# Patient Record
Sex: Female | Born: 1940 | Race: White | Hispanic: No | Marital: Married | Smoking: Never smoker
Health system: Southern US, Community
[De-identification: ages and names within clinical notes are randomized; demographics above are authoritative.]

## PROBLEM LIST (undated history)

## (undated) DIAGNOSIS — E0590 Thyrotoxicosis, unspecified without thyrotoxic crisis or storm: Secondary | ICD-10-CM

## (undated) DIAGNOSIS — E079 Disorder of thyroid, unspecified: Secondary | ICD-10-CM

## (undated) DIAGNOSIS — K56609 Unspecified intestinal obstruction, unspecified as to partial versus complete obstruction: Secondary | ICD-10-CM

## (undated) DIAGNOSIS — R0600 Dyspnea, unspecified: Secondary | ICD-10-CM

## (undated) DIAGNOSIS — J189 Pneumonia, unspecified organism: Secondary | ICD-10-CM

## (undated) DIAGNOSIS — I10 Essential (primary) hypertension: Secondary | ICD-10-CM

## (undated) DIAGNOSIS — M1990 Unspecified osteoarthritis, unspecified site: Secondary | ICD-10-CM

## (undated) DIAGNOSIS — F419 Anxiety disorder, unspecified: Secondary | ICD-10-CM

## (undated) HISTORY — PX: ABDOMINAL HYSTERECTOMY: SHX81

## (undated) HISTORY — PX: THYROID SURGERY: SHX805

## (undated) HISTORY — PX: CHOLECYSTECTOMY: SHX55

---

## 1978-07-10 HISTORY — PX: CHOLECYSTECTOMY: SHX55

## 1989-07-10 HISTORY — PX: THYROID SURGERY: SHX805

## 1990-07-10 HISTORY — PX: BREAST SURGERY: SHX581

## 1998-07-10 HISTORY — PX: ABDOMINAL HYSTERECTOMY: SHX81

## 1998-07-27 ENCOUNTER — Other Ambulatory Visit: Admission: RE | Admit: 1998-07-27 | Discharge: 1998-07-27 | Payer: Self-pay | Admitting: Obstetrics and Gynecology

## 1998-09-13 ENCOUNTER — Encounter: Payer: Self-pay | Admitting: Obstetrics and Gynecology

## 1998-09-14 ENCOUNTER — Ambulatory Visit (HOSPITAL_COMMUNITY): Admission: RE | Admit: 1998-09-14 | Discharge: 1998-09-14 | Payer: Self-pay | Admitting: Obstetrics and Gynecology

## 1998-12-28 ENCOUNTER — Inpatient Hospital Stay (HOSPITAL_COMMUNITY): Admission: RE | Admit: 1998-12-28 | Discharge: 1998-12-31 | Payer: Self-pay | Admitting: Obstetrics and Gynecology

## 1998-12-28 ENCOUNTER — Encounter (INDEPENDENT_AMBULATORY_CARE_PROVIDER_SITE_OTHER): Payer: Self-pay

## 1999-06-26 ENCOUNTER — Encounter: Payer: Self-pay | Admitting: Emergency Medicine

## 1999-06-26 ENCOUNTER — Emergency Department (HOSPITAL_COMMUNITY): Admission: EM | Admit: 1999-06-26 | Discharge: 1999-06-27 | Payer: Self-pay | Admitting: Emergency Medicine

## 2000-02-27 ENCOUNTER — Other Ambulatory Visit: Admission: RE | Admit: 2000-02-27 | Discharge: 2000-02-27 | Payer: Self-pay | Admitting: Obstetrics and Gynecology

## 2001-02-11 ENCOUNTER — Other Ambulatory Visit: Admission: RE | Admit: 2001-02-11 | Discharge: 2001-02-11 | Payer: Self-pay | Admitting: Obstetrics and Gynecology

## 2001-03-18 ENCOUNTER — Encounter: Payer: Self-pay | Admitting: Obstetrics and Gynecology

## 2001-03-18 ENCOUNTER — Encounter: Admission: RE | Admit: 2001-03-18 | Discharge: 2001-03-18 | Payer: Self-pay | Admitting: Obstetrics and Gynecology

## 2002-03-11 ENCOUNTER — Other Ambulatory Visit: Admission: RE | Admit: 2002-03-11 | Discharge: 2002-03-11 | Payer: Self-pay | Admitting: Gynecology

## 2003-04-24 ENCOUNTER — Other Ambulatory Visit: Admission: RE | Admit: 2003-04-24 | Discharge: 2003-04-24 | Payer: Self-pay | Admitting: Gynecology

## 2003-12-17 ENCOUNTER — Encounter: Admission: RE | Admit: 2003-12-17 | Discharge: 2003-12-17 | Payer: Self-pay | Admitting: Internal Medicine

## 2004-05-20 ENCOUNTER — Other Ambulatory Visit: Admission: RE | Admit: 2004-05-20 | Discharge: 2004-05-20 | Payer: Self-pay | Admitting: Gynecology

## 2005-10-25 ENCOUNTER — Other Ambulatory Visit: Admission: RE | Admit: 2005-10-25 | Discharge: 2005-10-25 | Payer: Self-pay | Admitting: Gynecology

## 2006-12-04 ENCOUNTER — Other Ambulatory Visit: Admission: RE | Admit: 2006-12-04 | Discharge: 2006-12-04 | Payer: Self-pay | Admitting: Gynecology

## 2014-08-14 ENCOUNTER — Emergency Department (HOSPITAL_COMMUNITY): Payer: Medicare PPO

## 2014-08-14 ENCOUNTER — Inpatient Hospital Stay (HOSPITAL_COMMUNITY)
Admission: EM | Admit: 2014-08-14 | Discharge: 2014-08-29 | DRG: 335 | Disposition: A | Discharge status: Home Health | Payer: Medicare PPO | Attending: Internal Medicine | Admitting: Internal Medicine

## 2014-08-14 ENCOUNTER — Encounter (HOSPITAL_COMMUNITY): Payer: Self-pay | Admitting: Emergency Medicine

## 2014-08-14 DIAGNOSIS — K649 Unspecified hemorrhoids: Secondary | ICD-10-CM | POA: Diagnosis present

## 2014-08-14 DIAGNOSIS — J9601 Acute respiratory failure with hypoxia: Secondary | ICD-10-CM | POA: Diagnosis not present

## 2014-08-14 DIAGNOSIS — Z79899 Other long term (current) drug therapy: Secondary | ICD-10-CM

## 2014-08-14 DIAGNOSIS — F419 Anxiety disorder, unspecified: Secondary | ICD-10-CM | POA: Diagnosis present

## 2014-08-14 DIAGNOSIS — K648 Other hemorrhoids: Secondary | ICD-10-CM | POA: Diagnosis present

## 2014-08-14 DIAGNOSIS — F32A Depression, unspecified: Secondary | ICD-10-CM | POA: Diagnosis present

## 2014-08-14 DIAGNOSIS — R1030 Lower abdominal pain, unspecified: Secondary | ICD-10-CM | POA: Diagnosis not present

## 2014-08-14 DIAGNOSIS — J9811 Atelectasis: Secondary | ICD-10-CM | POA: Diagnosis not present

## 2014-08-14 DIAGNOSIS — Z7982 Long term (current) use of aspirin: Secondary | ICD-10-CM

## 2014-08-14 DIAGNOSIS — R739 Hyperglycemia, unspecified: Secondary | ICD-10-CM | POA: Diagnosis present

## 2014-08-14 DIAGNOSIS — K565 Intestinal adhesions [bands] with obstruction (postprocedural) (postinfection): Principal | ICD-10-CM | POA: Diagnosis present

## 2014-08-14 DIAGNOSIS — E876 Hypokalemia: Secondary | ICD-10-CM | POA: Diagnosis not present

## 2014-08-14 DIAGNOSIS — J810 Acute pulmonary edema: Secondary | ICD-10-CM | POA: Diagnosis not present

## 2014-08-14 DIAGNOSIS — E039 Hypothyroidism, unspecified: Secondary | ICD-10-CM | POA: Diagnosis present

## 2014-08-14 DIAGNOSIS — J690 Pneumonitis due to inhalation of food and vomit: Secondary | ICD-10-CM | POA: Diagnosis not present

## 2014-08-14 DIAGNOSIS — Z79890 Hormone replacement therapy (postmenopausal): Secondary | ICD-10-CM

## 2014-08-14 DIAGNOSIS — Z9049 Acquired absence of other specified parts of digestive tract: Secondary | ICD-10-CM | POA: Diagnosis present

## 2014-08-14 DIAGNOSIS — T501X5A Adverse effect of loop [high-ceiling] diuretics, initial encounter: Secondary | ICD-10-CM | POA: Diagnosis present

## 2014-08-14 DIAGNOSIS — E870 Hyperosmolality and hypernatremia: Secondary | ICD-10-CM | POA: Diagnosis present

## 2014-08-14 DIAGNOSIS — E079 Disorder of thyroid, unspecified: Secondary | ICD-10-CM | POA: Diagnosis present

## 2014-08-14 DIAGNOSIS — F329 Major depressive disorder, single episode, unspecified: Secondary | ICD-10-CM | POA: Diagnosis present

## 2014-08-14 DIAGNOSIS — E878 Other disorders of electrolyte and fluid balance, not elsewhere classified: Secondary | ICD-10-CM | POA: Diagnosis present

## 2014-08-14 DIAGNOSIS — K56609 Unspecified intestinal obstruction, unspecified as to partial versus complete obstruction: Secondary | ICD-10-CM

## 2014-08-14 DIAGNOSIS — I10 Essential (primary) hypertension: Secondary | ICD-10-CM | POA: Diagnosis present

## 2014-08-14 DIAGNOSIS — R0600 Dyspnea, unspecified: Secondary | ICD-10-CM

## 2014-08-14 DIAGNOSIS — R0602 Shortness of breath: Secondary | ICD-10-CM

## 2014-08-14 DIAGNOSIS — D649 Anemia, unspecified: Secondary | ICD-10-CM | POA: Diagnosis present

## 2014-08-14 DIAGNOSIS — J95821 Acute postprocedural respiratory failure: Secondary | ICD-10-CM | POA: Diagnosis not present

## 2014-08-14 HISTORY — DX: Disorder of thyroid, unspecified: E07.9

## 2014-08-14 HISTORY — DX: Unspecified intestinal obstruction, unspecified as to partial versus complete obstruction: K56.609

## 2014-08-14 LAB — COMPREHENSIVE METABOLIC PANEL
ALK PHOS: 50 U/L (ref 39–117)
ALT: 25 U/L (ref 0–35)
AST: 30 U/L (ref 0–37)
Albumin: 4 g/dL (ref 3.5–5.2)
Anion gap: 8 (ref 5–15)
BUN: 20 mg/dL (ref 6–23)
CALCIUM: 8.7 mg/dL (ref 8.4–10.5)
CO2: 25 mmol/L (ref 19–32)
Chloride: 104 mmol/L (ref 96–112)
Creatinine, Ser: 0.83 mg/dL (ref 0.50–1.10)
GFR calc Af Amer: 78 mL/min — ABNORMAL LOW (ref >90)
GFR, EST NON AFRICAN AMERICAN: 67 mL/min — AB (ref >90)
Glucose, Bld: 125 mg/dL — ABNORMAL HIGH (ref 70–99)
POTASSIUM: 4 mmol/L (ref 3.5–5.1)
Sodium: 137 mmol/L (ref 135–145)
TOTAL PROTEIN: 7.5 g/dL (ref 6.0–8.3)
Total Bilirubin: 1.1 mg/dL (ref 0.3–1.2)

## 2014-08-14 LAB — LIPASE, BLOOD: Lipase: 20 U/L (ref 11–59)

## 2014-08-14 LAB — CBC WITH DIFFERENTIAL/PLATELET
Basophils Absolute: 0 10*3/uL (ref 0.0–0.1)
Basophils Relative: 0 % (ref 0–1)
EOS ABS: 0.1 10*3/uL (ref 0.0–0.7)
Eosinophils Relative: 1 % (ref 0–5)
HCT: 38.9 % (ref 36.0–46.0)
HEMOGLOBIN: 13 g/dL (ref 12.0–15.0)
LYMPHS ABS: 1.2 10*3/uL (ref 0.7–4.0)
Lymphocytes Relative: 11 % — ABNORMAL LOW (ref 12–46)
MCH: 29.7 pg (ref 26.0–34.0)
MCHC: 33.4 g/dL (ref 30.0–36.0)
MCV: 88.8 fL (ref 78.0–100.0)
MONO ABS: 0.5 10*3/uL (ref 0.1–1.0)
MONOS PCT: 5 % (ref 3–12)
NEUTROS PCT: 83 % — AB (ref 43–77)
Neutro Abs: 8.7 10*3/uL — ABNORMAL HIGH (ref 1.7–7.7)
PLATELETS: 209 10*3/uL (ref 150–400)
RBC: 4.38 MIL/uL (ref 3.87–5.11)
RDW: 13.1 % (ref 11.5–15.5)
WBC: 10.4 10*3/uL (ref 4.0–10.5)

## 2014-08-14 MED ORDER — IOHEXOL 300 MG/ML  SOLN
100.0000 mL | Freq: Once | INTRAMUSCULAR | Status: AC | PRN
  Administered 2014-08-14: 100 mL via INTRAVENOUS

## 2014-08-14 MED ORDER — IOHEXOL 300 MG/ML  SOLN: 50.0000 mL | Freq: Once | INTRAMUSCULAR | Status: AC | PRN

## 2014-08-14 MED ORDER — SODIUM CHLORIDE 0.9 % IV SOLN
INTRAVENOUS | Status: DC
  Administered 2014-08-14: 125 mL/h via INTRAVENOUS
  Administered 2014-08-16 (×2): via INTRAVENOUS
  Administered 2014-08-16: 125 mL/h via INTRAVENOUS
  Administered 2014-08-17 – 2014-08-18 (×2): via INTRAVENOUS

## 2014-08-14 NOTE — ED Notes (Signed)
Pt c/o abd pain x2 days. Pt had diarrhea and vomiting Wed when pain initially started then diarrhea this morning but no vomiting today.  Pt does state that she has dysuria and has to strain to get it out.

## 2014-08-14 NOTE — ED Provider Notes (Signed)
CSN: [id number]     Arrival date & time 08/14/14  1835 History   First MD Initiated Contact with Patient 08/14/14 2123     Chief Complaint  Patient presents with  . Abdominal Pain  . Diarrhea     (Consider location/radiation/quality/duration/timing/severity/associated sxs/prior Treatment) HPI    Jody Summers is a 74 y.o. female who presents for evaluation of abdominal pain present for 3 days, intermittently, which waxes and wanes.  The pain is a cramping sensation primarily in the suprapubic region.  She has intermittent vomiting, when she is having bouts of pain, but otherwise has been able to tolerate 3 meals a day and tolerate water.  Last episode of vomiting was about 48 hours ago.  She's never had this previously.  She denies chest pain, cough, weakness, dizziness.  There are no other no modifying factors.   Past Medical History  Diagnosis Date  . Thyroid disease    Past Surgical History  Procedure Laterality Date  . Cholecystectomy    . Abdominal hysterectomy    . Thyroid surgery     No family history on file. History  Substance Use Topics  . Smoking status: Never Smoker   . Smokeless tobacco: Not on file  . Alcohol Use: No   OB History    No data available     Review of Systems  All other systems reviewed and are negative.     Allergies  Review of patient's allergies indicates no known allergies.  Home Medications   Prior to Admission medications   Medication Sig Start Date End Date Taking? Authorizing Provider  aspirin 81 MG chewable tablet Chew 81 mg by mouth daily.   Yes Historical Provider, MD  Cholecalciferol (VITAMIN D3) 400 UNITS CAPS Take 400 mg by mouth daily.   Yes Historical Provider, MD  estradiol (VIVELLE-DOT) 0.05 MG/24HR patch Place 1 patch onto the skin once a week. Monday   Yes Historical Provider, MD  FLUoxetine (PROZAC) 20 MG capsule Take 20 mg by mouth at bedtime.   Yes Historical Provider, MD  labetalol (NORMODYNE) 100 MG tablet Take  50 mg by mouth 2 (two) times daily.   Yes Historical Provider, MD  levothyroxine (SYNTHROID, LEVOTHROID) 25 MCG tablet Take 25 mcg by mouth daily before breakfast. Monday Wednesday thursday Friday Sunday   Yes Historical Provider, MD  LORazepam (ATIVAN) 0.5 MG tablet Take 0.5 mg by mouth at bedtime.   Yes Historical Provider, MD  MIRTAZAPINE PO Take 1 tablet by mouth at bedtime.    Yes Historical Provider, MD  multivitamin-iron-minerals-folic acid (CENTRUM) chewable tablet Chew 1 tablet by mouth daily.   Yes Historical Provider, MD  pyridoxine (B-6) 100 MG tablet Take 200 mg by mouth daily.   Yes Historical Provider, MD   BP 108/47 mmHg  Pulse 85  Temp(Src) 98.8 F (37.1 C) (Oral)  Resp 16  Ht 5' 3.5" (1.613 m)  Wt 132 lb (59.875 kg)  BMI 23.01 kg/m2  SpO2 95% Physical Exam  Constitutional: She is oriented to person, place, and time. She appears well-developed.  Elderly, frail  HENT:  Head: Normocephalic and atraumatic.  Right Ear: External ear normal.  Left Ear: External ear normal.  Eyes: Conjunctivae and EOM are normal. Pupils are equal, round, and reactive to light.  Neck: Normal range of motion and phonation normal. Neck supple.  Cardiovascular: Normal rate, regular rhythm and normal heart sounds.   Pulmonary/Chest: Effort normal and breath sounds normal. She exhibits no bony tenderness.  Abdominal:  Soft. There is tenderness (left in right lower quadrant, mild.).  Musculoskeletal: Normal range of motion.  Neurological: She is alert and oriented to person, place, and time. No cranial nerve deficit or sensory deficit. She exhibits normal muscle tone. Coordination normal.  Skin: Skin is warm, dry and intact.  Psychiatric: She has a normal mood and affect. Her behavior is normal. Judgment and thought content normal.  Nursing note and vitals reviewed.   ED Course  Procedures (including critical care time)  Medications  0.9 %  sodium chloride infusion (125 mL/hr Intravenous New  Bag/Given 08/14/14 2151)  iohexol (OMNIPAQUE) 300 MG/ML solution 50 mL (not administered)  iohexol (OMNIPAQUE) 300 MG/ML solution 100 mL (100 mLs Intravenous Contrast Given 08/14/14 2322)    Patient Vitals for the past 24 hrs:  BP Temp Temp src Pulse Resp SpO2 Height Weight  08/15/14 0000 (!) 108/47 mmHg - - 85 16 95 % - -  08/14/14 1908 (!) 100/43 mmHg - - - - - - -  08/14/14 1906 - 98.8 F (37.1 C) Oral 94 18 96 % 5' 3.5" (1.613 m) 132 lb (59.875 kg)    00:25 Reevaluation with update and discussion. After initial assessment and treatment, an updated evaluation reveals she vomited once after oral contrast.  Findings discussed with patient, and husband. Afnan Emberton L   00:30- discussed with general surgery, who prefers to be involved with consultants.  He will see the patient in the morning.  He recommends two-view abdominal series in the morning.  12:37 AM-Consult complete with Hospitalist Joseph Art). Patient case explained and discussed. He agrees to admit patient for further evaluation and treatment. Call ended at 0055   Labs Review Labs Reviewed  CBC WITH DIFFERENTIAL/PLATELET - Abnormal; Notable for the following:    Neutrophils Relative % 83 (*)    Neutro Abs 8.7 (*)    Lymphocytes Relative 11 (*)    All other components within normal limits  COMPREHENSIVE METABOLIC PANEL - Abnormal; Notable for the following:    Glucose, Bld 125 (*)    GFR calc non Af Amer 67 (*)    GFR calc Af Amer 78 (*)    All other components within normal limits  URINALYSIS, ROUTINE W REFLEX MICROSCOPIC - Abnormal; Notable for the following:    Specific Gravity, Urine 1.044 (*)    All other components within normal limits  LIPASE, BLOOD    Imaging Review Ct Abdomen Pelvis W Contrast  08/14/2014   CLINICAL DATA:  Two-day history of abdominal pain with diarrhea and vomiting.  EXAM: CT ABDOMEN AND PELVIS WITH CONTRAST  TECHNIQUE: Multidetector CT imaging of the abdomen and pelvis was performed using the  standard protocol following bolus administration of intravenous contrast.  CONTRAST:  OMNIPAQUE IOHEXOL 300 MG/ML  SOLN  COMPARISON:  None.  FINDINGS: Lower chest: Dependent atelectasis and scarring changes. No pleural effusion. The heart is normal in size. No pericardial effusion. The distal esophagus is grossly normal. There is a small hiatal hernia.  Hepatobiliary: No focal hepatic lesions or intrahepatic biliary dilatation. The gallbladder is surgically absent. No common bowel duct dilatation.  Pancreas: Normal  Spleen: Normal  Adrenals/Urinary Tract: Normal  Stomach/Bowel: The stomach and duodenum are unremarkable. The small bowel is dilated with air-fluid levels. Stable areas of caliber change in the pelvis with a decompressed/ normal-appearing terminal ileum. Findings most likely due to pelvic adhesions. The midportion of the appendix is dilated and fluid-filled. The proximal and distal aspects of the appendix appears normal in  caliber. There is mild mucosal enhancement. No significant inflammatory changes.  Vascular/Lymphatic: No mesenteric or retroperitoneal mass or adenopathy. Small scattered lymph nodes are noted. The aorta is normal in caliber. The branch vessels are patent. The major venous structures are patent.  Reproductive: The uterus is surgically absent. The ovaries are not identified. The bladder is normal. No pelvic mass or adenopathy. There is a small amount of free pelvic fluid. No inguinal mass or adenopathy.  Other: No abdominal wall hernia or subcutaneous lesions  Musculoskeletal: No significant bony findings.  IMPRESSION: 1. CT findings consistent with a small-bowel obstruction likely due to pelvic adhesions. 2. Dilatation of the mid appendix which is fluid-filled. This may be due to fluid in the cecum. I do not see any significant periappendiceal inflammatory change but recommend correlation with clinical findings and close clinical followup.   Electronically Signed   By: Loralie ChampagneMark   Gallerani M.D.   On: 08/14/2014 23:56     EKG Interpretation None      MDM   Final diagnoses:  Small bowel obstruction      Small bowel obstruction, likely secondary to pelvic adhesions.  She will need be.  For observation and possible treatment.   Nursing Notes Reviewed/ Care Coordinated Applicable Imaging Reviewed Interpretation of Laboratory Data incorporated into ED treatment   Plan: Admit  Flint MelterElliott L Mckenna Gamm, MD 08/15/14 [phone]

## 2014-08-14 NOTE — ED Notes (Signed)
MD at bedside. 

## 2014-08-15 ENCOUNTER — Encounter (HOSPITAL_COMMUNITY): Payer: Self-pay | Admitting: *Deleted

## 2014-08-15 ENCOUNTER — Inpatient Hospital Stay (HOSPITAL_COMMUNITY): Payer: Medicare PPO

## 2014-08-15 DIAGNOSIS — E876 Hypokalemia: Secondary | ICD-10-CM | POA: Diagnosis not present

## 2014-08-15 DIAGNOSIS — R739 Hyperglycemia, unspecified: Secondary | ICD-10-CM | POA: Diagnosis present

## 2014-08-15 DIAGNOSIS — R1030 Lower abdominal pain, unspecified: Secondary | ICD-10-CM | POA: Diagnosis present

## 2014-08-15 DIAGNOSIS — Z79890 Hormone replacement therapy (postmenopausal): Secondary | ICD-10-CM

## 2014-08-15 DIAGNOSIS — J810 Acute pulmonary edema: Secondary | ICD-10-CM | POA: Diagnosis not present

## 2014-08-15 DIAGNOSIS — J9601 Acute respiratory failure with hypoxia: Secondary | ICD-10-CM | POA: Diagnosis not present

## 2014-08-15 DIAGNOSIS — J9811 Atelectasis: Secondary | ICD-10-CM | POA: Diagnosis not present

## 2014-08-15 DIAGNOSIS — K565 Intestinal adhesions [bands] with obstruction (postprocedural) (postinfection): Secondary | ICD-10-CM | POA: Diagnosis not present

## 2014-08-15 DIAGNOSIS — I10 Essential (primary) hypertension: Secondary | ICD-10-CM | POA: Diagnosis not present

## 2014-08-15 DIAGNOSIS — F419 Anxiety disorder, unspecified: Secondary | ICD-10-CM | POA: Diagnosis present

## 2014-08-15 DIAGNOSIS — F32A Depression, unspecified: Secondary | ICD-10-CM | POA: Diagnosis present

## 2014-08-15 DIAGNOSIS — K649 Unspecified hemorrhoids: Secondary | ICD-10-CM | POA: Diagnosis present

## 2014-08-15 DIAGNOSIS — E039 Hypothyroidism, unspecified: Secondary | ICD-10-CM | POA: Diagnosis present

## 2014-08-15 DIAGNOSIS — T501X5A Adverse effect of loop [high-ceiling] diuretics, initial encounter: Secondary | ICD-10-CM | POA: Diagnosis present

## 2014-08-15 DIAGNOSIS — D649 Anemia, unspecified: Secondary | ICD-10-CM | POA: Diagnosis present

## 2014-08-15 DIAGNOSIS — K5669 Other intestinal obstruction: Secondary | ICD-10-CM

## 2014-08-15 DIAGNOSIS — Z7982 Long term (current) use of aspirin: Secondary | ICD-10-CM | POA: Diagnosis not present

## 2014-08-15 DIAGNOSIS — J95821 Acute postprocedural respiratory failure: Secondary | ICD-10-CM | POA: Diagnosis not present

## 2014-08-15 DIAGNOSIS — F329 Major depressive disorder, single episode, unspecified: Secondary | ICD-10-CM | POA: Diagnosis present

## 2014-08-15 DIAGNOSIS — K56609 Unspecified intestinal obstruction, unspecified as to partial versus complete obstruction: Secondary | ICD-10-CM | POA: Diagnosis present

## 2014-08-15 DIAGNOSIS — J690 Pneumonitis due to inhalation of food and vomit: Secondary | ICD-10-CM | POA: Diagnosis not present

## 2014-08-15 DIAGNOSIS — E878 Other disorders of electrolyte and fluid balance, not elsewhere classified: Secondary | ICD-10-CM | POA: Diagnosis present

## 2014-08-15 DIAGNOSIS — K648 Other hemorrhoids: Secondary | ICD-10-CM | POA: Diagnosis present

## 2014-08-15 DIAGNOSIS — Z79899 Other long term (current) drug therapy: Secondary | ICD-10-CM | POA: Diagnosis not present

## 2014-08-15 DIAGNOSIS — E079 Disorder of thyroid, unspecified: Secondary | ICD-10-CM | POA: Diagnosis present

## 2014-08-15 DIAGNOSIS — E870 Hyperosmolality and hypernatremia: Secondary | ICD-10-CM | POA: Diagnosis not present

## 2014-08-15 DIAGNOSIS — Z9049 Acquired absence of other specified parts of digestive tract: Secondary | ICD-10-CM | POA: Diagnosis present

## 2014-08-15 HISTORY — DX: Unspecified intestinal obstruction, unspecified as to partial versus complete obstruction: K56.609

## 2014-08-15 LAB — URINALYSIS, ROUTINE W REFLEX MICROSCOPIC
BILIRUBIN URINE: NEGATIVE
Glucose, UA: NEGATIVE mg/dL
Hgb urine dipstick: NEGATIVE
KETONES UR: NEGATIVE mg/dL
Leukocytes, UA: NEGATIVE
NITRITE: NEGATIVE
Protein, ur: NEGATIVE mg/dL
SPECIFIC GRAVITY, URINE: 1.044 — AB (ref 1.005–1.030)
Urobilinogen, UA: 0.2 mg/dL (ref 0.0–1.0)
pH: 6 (ref 5.0–8.0)

## 2014-08-15 LAB — CBC WITH DIFFERENTIAL/PLATELET
BASOS ABS: 0 10*3/uL (ref 0.0–0.1)
Basophils Relative: 0 % (ref 0–1)
EOS ABS: 0.1 10*3/uL (ref 0.0–0.7)
EOS PCT: 1 % (ref 0–5)
HCT: 36.6 % (ref 36.0–46.0)
Hemoglobin: 12.3 g/dL (ref 12.0–15.0)
LYMPHS ABS: 1 10*3/uL (ref 0.7–4.0)
Lymphocytes Relative: 10 % — ABNORMAL LOW (ref 12–46)
MCH: 29.8 pg (ref 26.0–34.0)
MCHC: 33.6 g/dL (ref 30.0–36.0)
MCV: 88.6 fL (ref 78.0–100.0)
MONOS PCT: 6 % (ref 3–12)
Monocytes Absolute: 0.5 10*3/uL (ref 0.1–1.0)
Neutro Abs: 8.1 10*3/uL — ABNORMAL HIGH (ref 1.7–7.7)
Neutrophils Relative %: 83 % — ABNORMAL HIGH (ref 43–77)
PLATELETS: 189 10*3/uL (ref 150–400)
RBC: 4.13 MIL/uL (ref 3.87–5.11)
RDW: 13.3 % (ref 11.5–15.5)
WBC: 9.8 10*3/uL (ref 4.0–10.5)

## 2014-08-15 LAB — TSH: TSH: 0.681 u[IU]/mL (ref 0.350–4.500)

## 2014-08-15 LAB — COMPREHENSIVE METABOLIC PANEL
ALK PHOS: 51 U/L (ref 39–117)
ALT: 23 U/L (ref 0–35)
ANION GAP: 10 (ref 5–15)
AST: 26 U/L (ref 0–37)
Albumin: 3.7 g/dL (ref 3.5–5.2)
BUN: 17 mg/dL (ref 6–23)
CO2: 23 mmol/L (ref 19–32)
Calcium: 8.4 mg/dL (ref 8.4–10.5)
Chloride: 106 mmol/L (ref 96–112)
Creatinine, Ser: 0.76 mg/dL (ref 0.50–1.10)
GFR calc Af Amer: >90 mL/min (ref >90)
GFR, EST NON AFRICAN AMERICAN: 80 mL/min — AB (ref >90)
Glucose, Bld: 113 mg/dL — ABNORMAL HIGH (ref 70–99)
POTASSIUM: 3.7 mmol/L (ref 3.5–5.1)
Sodium: 139 mmol/L (ref 135–145)
Total Bilirubin: 1.3 mg/dL — ABNORMAL HIGH (ref 0.3–1.2)
Total Protein: 6.9 g/dL (ref 6.0–8.3)

## 2014-08-15 LAB — LIPASE, BLOOD: Lipase: 18 U/L (ref 11–59)

## 2014-08-15 MED ORDER — ESTRADIOL 0.05 MG/24HR TD PTTW
0.0500 mg | MEDICATED_PATCH | TRANSDERMAL | Status: DC
  Administered 2014-08-17 – 2014-08-24 (×2): 0.05 mg via TRANSDERMAL
  Filled 2014-08-15 (×2): qty 1

## 2014-08-15 MED ORDER — ONDANSETRON HCL 4 MG/2ML IJ SOLN
4.0000 mg | Freq: Four times a day (QID) | INTRAMUSCULAR | Status: DC | PRN
  Administered 2014-08-15 – 2014-08-27 (×5): 4 mg via INTRAVENOUS
  Filled 2014-08-15 (×5): qty 2

## 2014-08-15 MED ORDER — LORAZEPAM 2 MG/ML IJ SOLN
0.5000 mg | Freq: Every day | INTRAMUSCULAR | Status: DC
  Administered 2014-08-15 – 2014-08-17 (×3): 0.5 mg via INTRAVENOUS
  Filled 2014-08-15 (×4): qty 1

## 2014-08-15 MED ORDER — LABETALOL HCL 5 MG/ML IV SOLN
50.0000 mg | Freq: Two times a day (BID) | INTRAVENOUS | Status: DC
  Filled 2014-08-15 (×3): qty 12

## 2014-08-15 MED ORDER — LEVOTHYROXINE SODIUM 100 MCG IV SOLR
12.5000 ug | INTRAVENOUS | Status: DC
  Administered 2014-08-16 – 2014-08-17 (×2): 12.5 ug via INTRAVENOUS
  Filled 2014-08-15 (×3): qty 5

## 2014-08-15 MED ORDER — LEVOTHYROXINE SODIUM 100 MCG IV SOLR: 12.5000 ug | Freq: Every day | INTRAVENOUS | Status: DC

## 2014-08-15 MED ORDER — LABETALOL HCL 5 MG/ML IV SOLN
5.0000 mg | INTRAVENOUS | Status: DC | PRN
  Administered 2014-08-22 – 2014-08-23 (×3): 5 mg via INTRAVENOUS
  Filled 2014-08-15 (×4): qty 4

## 2014-08-15 MED ORDER — LORAZEPAM 2 MG/ML IJ SOLN: 1.0000 mg | Freq: Every day | INTRAMUSCULAR | Status: DC

## 2014-08-15 MED ORDER — LIP MEDEX EX OINT
TOPICAL_OINTMENT | CUTANEOUS | Status: AC
  Filled 2014-08-15: qty 7

## 2014-08-15 MED ORDER — SODIUM CHLORIDE 0.9 % IV SOLN
INTRAVENOUS | Status: AC
  Administered 2014-08-15: 1000 mL via INTRAVENOUS

## 2014-08-15 NOTE — ED Notes (Signed)
Hospitalist at bedside 

## 2014-08-15 NOTE — Progress Notes (Signed)
Patient seen and examined at the bedside. Patient admitted for small bowel obstruction. She has NG tube in place. Agree with assessment and [facility]. - Continue nothing by mouth, NG tube to low suction, supportive care with IV fluids and analgesia/antiemetics as needed. Surgery following.  Manson Passeylma Maribelle Hopple [facility] [phone]

## 2014-08-15 NOTE — Consult Note (Signed)
Reason for Consult:  SBO Referring Physician: Dr .Myrtis Ser Jody Summers is an 74 y.o. female.  HPI: She had the onset of crampy central abdominal pain and distension 3 days ago.  This was followed by nausea and vomiting.  The pain persisted and she stopped having BMs (last was 4 days ago).  She presented to ED and was found to have an SBO.  She has had 3 previous abdominal surgeries.  Past Medical History  Diagnosis Date  . Thyroid disease     Past Surgical History  Procedure Laterality Date  . Cholecystectomy    . Abdominal hysterectomy    . Thyroid surgery      No family history on file.  Social History:  reports that she has never smoked. She does not have any smokeless tobacco history on file. She reports that she does not drink alcohol. Her drug history is not on file.  Allergies: No Known Allergies  Prior to Admission medications   Medication Sig Start Date End Date Taking? Authorizing Provider  aspirin 81 MG chewable tablet Chew 81 mg by mouth daily.   Yes Historical Provider, MD  Cholecalciferol (VITAMIN D3) 400 UNITS CAPS Take 400 mg by mouth daily.   Yes Historical Provider, MD  estradiol (VIVELLE-DOT) 0.05 MG/24HR patch Place 1 patch onto the skin once a week. Monday   Yes Historical Provider, MD  FLUoxetine (PROZAC) 20 MG capsule Take 20 mg by mouth at bedtime.   Yes Historical Provider, MD  labetalol (NORMODYNE) 100 MG tablet Take 50 mg by mouth 2 (two) times daily.   Yes Historical Provider, MD  levothyroxine (SYNTHROID, LEVOTHROID) 25 MCG tablet Take 25 mcg by mouth daily before breakfast. Monday Wednesday thursday Friday Sunday   Yes Historical Provider, MD  LORazepam (ATIVAN) 0.5 MG tablet Take 0.5 mg by mouth at bedtime.   Yes Historical Provider, MD  MIRTAZAPINE PO Take 1 tablet by mouth at bedtime.    Yes Historical Provider, MD  multivitamin-iron-minerals-folic acid (CENTRUM) chewable tablet Chew 1 tablet by mouth daily.   Yes Historical Provider, MD   pyridoxine (B-6) 100 MG tablet Take 200 mg by mouth daily.   Yes Historical Provider, MD     Results for orders placed or performed during the hospital encounter of 08/14/14 (from the past 48 hour(s))  CBC with Differential     Status: Abnormal   Collection Time: 08/14/14  7:38 PM  Result Value Ref Range   WBC 10.4 4.0 - 10.5 K/uL   RBC 4.38 3.87 - 5.11 MIL/uL   Hemoglobin 13.0 12.0 - 15.0 g/dL   HCT 38.9 36.0 - 46.0 %   MCV 88.8 78.0 - 100.0 fL   MCH 29.7 26.0 - 34.0 pg   MCHC 33.4 30.0 - 36.0 g/dL   RDW 13.1 11.5 - 15.5 %   Platelets 209 150 - 400 K/uL   Neutrophils Relative % 83 (H) 43 - 77 %   Neutro Abs 8.7 (H) 1.7 - 7.7 K/uL   Lymphocytes Relative 11 (L) 12 - 46 %   Lymphs Abs 1.2 0.7 - 4.0 K/uL   Monocytes Relative 5 3 - 12 %   Monocytes Absolute 0.5 0.1 - 1.0 K/uL   Eosinophils Relative 1 0 - 5 %   Eosinophils Absolute 0.1 0.0 - 0.7 K/uL   Basophils Relative 0 0 - 1 %   Basophils Absolute 0.0 0.0 - 0.1 K/uL  Comprehensive metabolic panel     Status: Abnormal   Collection  Time: 08/14/14  7:38 PM  Result Value Ref Range   Sodium 137 135 - 145 mmol/L   Potassium 4.0 3.5 - 5.1 mmol/L   Chloride 104 96 - 112 mmol/L   CO2 25 19 - 32 mmol/L   Glucose, Bld 125 (H) 70 - 99 mg/dL   BUN 20 6 - 23 mg/dL   Creatinine, Ser 0.83 0.50 - 1.10 mg/dL   Calcium 8.7 8.4 - 10.5 mg/dL   Total Protein 7.5 6.0 - 8.3 g/dL   Albumin 4.0 3.5 - 5.2 g/dL   AST 30 0 - 37 U/L   ALT 25 0 - 35 U/L   Alkaline Phosphatase 50 39 - 117 U/L   Total Bilirubin 1.1 0.3 - 1.2 mg/dL   GFR calc non Af Amer 67 (L) >90 mL/min   GFR calc Af Amer 78 (L) >90 mL/min    Comment: (NOTE) The eGFR has been calculated using the CKD EPI equation. This calculation has not been validated in all clinical situations. eGFR's persistently <90 mL/min signify possible Chronic Kidney Disease.    Anion gap 8 5 - 15  Lipase, blood     Status: None   Collection Time: 08/14/14  7:38 PM  Result Value Ref Range   Lipase  20 11 - 59 U/L  Urinalysis, Routine w reflex microscopic     Status: Abnormal   Collection Time: 08/15/14 12:09 AM  Result Value Ref Range   Color, Urine YELLOW YELLOW   APPearance CLEAR CLEAR   Specific Gravity, Urine 1.044 (H) 1.005 - 1.030   pH 6.0 5.0 - 8.0   Glucose, UA NEGATIVE NEGATIVE mg/dL   Hgb urine dipstick NEGATIVE NEGATIVE   Bilirubin Urine NEGATIVE NEGATIVE   Ketones, ur NEGATIVE NEGATIVE mg/dL   Protein, ur NEGATIVE NEGATIVE mg/dL   Urobilinogen, UA 0.2 0.0 - 1.0 mg/dL   Nitrite NEGATIVE NEGATIVE   Leukocytes, UA NEGATIVE NEGATIVE    Comment: MICROSCOPIC NOT DONE ON URINES WITH NEGATIVE PROTEIN, BLOOD, LEUKOCYTES, NITRITE, OR GLUCOSE <1000 mg/dL.  CBC with Differential/Platelet     Status: Abnormal   Collection Time: 08/15/14  5:34 AM  Result Value Ref Range   WBC 9.8 4.0 - 10.5 K/uL   RBC 4.13 3.87 - 5.11 MIL/uL   Hemoglobin 12.3 12.0 - 15.0 g/dL   HCT 36.6 36.0 - 46.0 %   MCV 88.6 78.0 - 100.0 fL   MCH 29.8 26.0 - 34.0 pg   MCHC 33.6 30.0 - 36.0 g/dL   RDW 13.3 11.5 - 15.5 %   Platelets 189 150 - 400 K/uL   Neutrophils Relative % 83 (H) 43 - 77 %   Neutro Abs 8.1 (H) 1.7 - 7.7 K/uL   Lymphocytes Relative 10 (L) 12 - 46 %   Lymphs Abs 1.0 0.7 - 4.0 K/uL   Monocytes Relative 6 3 - 12 %   Monocytes Absolute 0.5 0.1 - 1.0 K/uL   Eosinophils Relative 1 0 - 5 %   Eosinophils Absolute 0.1 0.0 - 0.7 K/uL   Basophils Relative 0 0 - 1 %   Basophils Absolute 0.0 0.0 - 0.1 K/uL  Comprehensive metabolic panel     Status: Abnormal   Collection Time: 08/15/14  5:34 AM  Result Value Ref Range   Sodium 139 135 - 145 mmol/L   Potassium 3.7 3.5 - 5.1 mmol/L   Chloride 106 96 - 112 mmol/L   CO2 23 19 - 32 mmol/L   Glucose, Bld 113 (H) 70 -  99 mg/dL   BUN 17 6 - 23 mg/dL   Creatinine, Ser 0.76 0.50 - 1.10 mg/dL   Calcium 8.4 8.4 - 10.5 mg/dL   Total Protein 6.9 6.0 - 8.3 g/dL   Albumin 3.7 3.5 - 5.2 g/dL   AST 26 0 - 37 U/L   ALT 23 0 - 35 U/L   Alkaline Phosphatase  51 39 - 117 U/L   Total Bilirubin 1.3 (H) 0.3 - 1.2 mg/dL   GFR calc non Af Amer 80 (L) >90 mL/min   GFR calc Af Amer >90 >90 mL/min    Comment: (NOTE) The eGFR has been calculated using the CKD EPI equation. This calculation has not been validated in all clinical situations. eGFR's persistently <90 mL/min signify possible Chronic Kidney Disease.    Anion gap 10 5 - 15  Lipase, blood     Status: None   Collection Time: 08/15/14  5:34 AM  Result Value Ref Range   Lipase 18 11 - 59 U/L    Ct Abdomen Pelvis W Contrast  08/14/2014   CLINICAL DATA:  Two-day history of abdominal pain with diarrhea and vomiting.  EXAM: CT ABDOMEN AND PELVIS WITH CONTRAST  TECHNIQUE: Multidetector CT imaging of the abdomen and pelvis was performed using the standard protocol following bolus administration of intravenous contrast.  CONTRAST:  129m OMNIPAQUE IOHEXOL 300 MG/ML  SOLN  COMPARISON:  None.  FINDINGS: Lower chest: Dependent atelectasis and scarring changes. No pleural effusion. The heart is normal in size. No pericardial effusion. The distal esophagus is grossly normal. There is a small hiatal hernia.  Hepatobiliary: No focal hepatic lesions or intrahepatic biliary dilatation. The gallbladder is surgically absent. No common bowel duct dilatation.  Pancreas: Normal  Spleen: Normal  Adrenals/Urinary Tract: Normal  Stomach/Bowel: The stomach and duodenum are unremarkable. The small bowel is dilated with air-fluid levels. Stable areas of caliber change in the pelvis with a decompressed/ normal-appearing terminal ileum. Findings most likely due to pelvic adhesions. The midportion of the appendix is dilated and fluid-filled. The proximal and distal aspects of the appendix appears normal in caliber. There is mild mucosal enhancement. No significant inflammatory changes.  Vascular/Lymphatic: No mesenteric or retroperitoneal mass or adenopathy. Small scattered lymph nodes are noted. The aorta is normal in caliber. The  branch vessels are patent. The major venous structures are patent.  Reproductive: The uterus is surgically absent. The ovaries are not identified. The bladder is normal. No pelvic mass or adenopathy. There is a small amount of free pelvic fluid. No inguinal mass or adenopathy.  Other: No abdominal wall hernia or subcutaneous lesions  Musculoskeletal: No significant bony findings.  IMPRESSION: 1. CT findings consistent with a small-bowel obstruction likely due to pelvic adhesions. 2. Dilatation of the mid appendix which is fluid-filled. This may be due to fluid in the cecum. I do not see any significant periappendiceal inflammatory change but recommend correlation with clinical findings and close clinical followup.   Electronically Signed   By: MKalman JewelsM.D.   On: 08/14/2014 23:56    Review of Systems  Constitutional: Negative for fever.  Gastrointestinal: Positive for nausea, vomiting and abdominal pain.   Blood pressure 131/59, pulse 91, temperature 98.4 F (36.9 C), temperature source Oral, resp. rate 16, height _0  (1.626 m), weight 135 lb 12.9 oz (61.6 kg), SpO2 96 %. Physical Exam  Constitutional: She appears well-developed and well-nourished. No distress.  HENT:  Head: Normocephalic and atraumatic.  Ng tube in draining green fluid  Eyes: No scleral icterus.  Cardiovascular: Normal rate and regular rhythm.   GI: Soft. There is no tenderness.  RUQ scar.  Lower midline scar.  Hypoactive bowel sounds.  No palpable incisonal hernias.  Neurological: She is alert.  Skin: Skin is warm and dry.  Psychiatric: She has a normal mood and affect. Her behavior is normal.    Assessment/Plan: SBO most likely from adhesions.  Rec:  Non-operative management with ng decompression, hydration.  Recheck x-rays.  If no progression ->  Exploratory lap.  Kecia Swoboda J 08/15/2014, 9:07 AM

## 2014-08-15 NOTE — H&P (Signed)
[facility] History and Physical  Jody Summers:[id number] DOB: 1941/01/13 DOA: 08/14/2014  Referring physician:  PCP: Gwen Pounds, MD  Specialists:   Chief Complaint: Abdominal pain 3 days  HPI Jody Summers is a 73 y.o.WF PMHx Anxiety, Depression, HTN, thyroid disease, HRT Presents for evaluation of abdominal pain present for 3 days, intermittently, which waxes and wanes. The pain is a cramping sensation primarily in the suprapubic region. She has intermittent vomiting, when she is having bouts of pain, but otherwise has been able to tolerate 3 meals a day and tolerate water. Last episode of vomiting was about 48 hours ago. She's never had this previously. She denies chest pain, cough, weakness, dizziness. There are no other no modifying factors.   Review of Systems: The patient denies fever, weight loss,, vision loss, decreased hearing, hoarseness, chest pain, syncope, dyspnea on exertion, peripheral edema, balance deficits, hemoptysis,,melena, hematochezia, severe indigestion/heartburn, hematuria, incontinence, genital sores, muscle weakness, suspicious skin lesions, transient blindness, difficulty walking, unusual weight change, abnormal bleeding, enlarged lymph nodes, angioedema, and breast masses.    TRAVEL HISTORY: NA   Consultants:  ED Dr. Mancel Bale discussed with general surgery who will see patient in a.m.  Procedure/Significant Events:  2/5 CT abdomen pelvis with contrast; SBO likely due to pelvic adhesions. -Dilatation of the mid appendix which is fluid-filled. May be due to fluid in the cecum.    Culture  NA   Antibiotics:  NA   DVT prophylaxis:  Lovenox  Devices  NG tube   LINES / TUBES:     Past Medical History  Diagnosis Date  . Thyroid disease    Past Surgical History  Procedure Laterality Date  . Cholecystectomy    . Abdominal hysterectomy    . Thyroid surgery     Social History:  reports that she has never smoked. She  does not have any smokeless tobacco history on file. She reports that she does not drink alcohol. Her drug history is not on file.  where does patient live--home, ALF, SNF? Home with husband Can patient participate in ADLs? Yes  No Known Allergies  No family history on file.   Prior to Admission medications   Medication Sig Start Date End Date Taking? Authorizing Provider  aspirin 81 MG chewable tablet Chew 81 mg by mouth daily.   Yes Historical Provider, MD  Cholecalciferol (VITAMIN D3) 400 UNITS CAPS Take 400 mg by mouth daily.   Yes Historical Provider, MD  estradiol (VIVELLE-DOT) 0.05 MG/24HR patch Place 1 patch onto the skin once a week. Monday   Yes Historical Provider, MD  FLUoxetine (PROZAC) 20 MG capsule Take 20 mg by mouth at bedtime.   Yes Historical Provider, MD  labetalol (NORMODYNE) 100 MG tablet Take 50 mg by mouth 2 (two) times daily.   Yes Historical Provider, MD  levothyroxine (SYNTHROID, LEVOTHROID) 25 MCG tablet Take 25 mcg by mouth daily before breakfast. Monday Wednesday thursday Friday Sunday   Yes Historical Provider, MD  LORazepam (ATIVAN) 0.5 MG tablet Take 0.5 mg by mouth at bedtime.   Yes Historical Provider, MD  MIRTAZAPINE PO Take 1 tablet by mouth at bedtime.    Yes Historical Provider, MD  multivitamin-iron-minerals-folic acid (CENTRUM) chewable tablet Chew 1 tablet by mouth daily.   Yes Historical Provider, MD  pyridoxine (B-6) 100 MG tablet Take 200 mg by mouth daily.   Yes Historical Provider, MD   Physical Exam: Filed Vitals:   08/14/14 1906 08/14/14 1908 08/15/14 0000  BP:  100/43 108/47  Pulse: 94  85  Temp: 98.8 F (37.1 C)    TempSrc: Oral    Resp: 18  16  Height: 5' 3.5" (1.613 m)    Weight: 59.875 kg (132 lb)    SpO2: 96%  95%     General:  A/O 4, NAD,  Eyes: He was equal round reactive to light and accommodation  Neck: Negative JVD, negative lymphadenopathy  Cardiovascular: Regular rhythm and rate, negative murmurs rubs or gallops,  normal S1/S2  Respiratory: Clear to auscultation bilateral  Abdomen: Pain to palpation RLQ  Skin: No lacerations/lesions  Musculoskeletal: Bilateral lower extremity negative pedal edema, erythema, pain to palpation  Neurologic: Within normal limit  Labs on Admission:  Basic Metabolic Panel:  Recent Labs Lab 08/14/14 1938  NA 137  K 4.0  CL 104  CO2 25  GLUCOSE 125*  BUN 20  CREATININE 0.83  CALCIUM 8.7   Liver Function Tests:  Recent Labs Lab 08/14/14 1938  AST 30  ALT 25  ALKPHOS 50  BILITOT 1.1  PROT 7.5  ALBUMIN 4.0    Recent Labs Lab 08/14/14 1938  LIPASE 20   No results for input(s): AMMONIA in the last 168 hours. CBC:  Recent Labs Lab 08/14/14 1938  WBC 10.4  NEUTROABS 8.7*  HGB 13.0  HCT 38.9  MCV 88.8  PLT 209   Cardiac Enzymes: No results for input(s): CKTOTAL, CKMB, CKMBINDEX, TROPONINI in the last 168 hours.  BNP (last 3 results) No results for input(s): BNP in the last 8760 hours.  ProBNP (last 3 results) No results for input(s): PROBNP in the last 8760 hours.  CBG: No results for input(s): GLUCAP in the last 168 hours.  Radiological Exams on Admission: Ct Abdomen Pelvis W Contrast  08/14/2014   CLINICAL DATA:  Two-day history of abdominal pain with diarrhea and vomiting.  EXAM: CT ABDOMEN AND PELVIS WITH CONTRAST  TECHNIQUE: Multidetector CT imaging of the abdomen and pelvis was performed using the standard protocol following bolus administration of intravenous contrast.  CONTRAST:  100mL OMNIPAQUE IOHEXOL 300 MG/ML  SOLN  COMPARISON:  None.  FINDINGS: Lower chest: Dependent atelectasis and scarring changes. No pleural effusion. The heart is normal in size. No pericardial effusion. The distal esophagus is grossly normal. There is a small hiatal hernia.  Hepatobiliary: No focal hepatic lesions or intrahepatic biliary dilatation. The gallbladder is surgically absent. No common bowel duct dilatation.  Pancreas: Normal  Spleen: Normal   Adrenals/Urinary Tract: Normal  Stomach/Bowel: The stomach and duodenum are unremarkable. The small bowel is dilated with air-fluid levels. Stable areas of caliber change in the pelvis with a decompressed/ normal-appearing terminal ileum. Findings most likely due to pelvic adhesions. The midportion of the appendix is dilated and fluid-filled. The proximal and distal aspects of the appendix appears normal in caliber. There is mild mucosal enhancement. No significant inflammatory changes.  Vascular/Lymphatic: No mesenteric or retroperitoneal mass or adenopathy. Small scattered lymph nodes are noted. The aorta is normal in caliber. The branch vessels are patent. The major venous structures are patent.  Reproductive: The uterus is surgically absent. The ovaries are not identified. The bladder is normal. No pelvic mass or adenopathy. There is a small amount of free pelvic fluid. No inguinal mass or adenopathy.  Other: No abdominal wall hernia or subcutaneous lesions  Musculoskeletal: No significant bony findings.  IMPRESSION: 1. CT findings consistent with a small-bowel obstruction likely due to pelvic adhesions. 2. Dilatation of the mid appendix which is fluid-filled.  This may be due to fluid in the cecum. I do not see any significant periappendiceal inflammatory change but recommend correlation with clinical findings and close clinical followup.   Electronically Signed   By: Loralie Champagne M.D.   On: 08/14/2014 23:56    EKG:   Assessment/Plan Principal Problem:   SBO (small bowel obstruction) Active Problems:   Anxiety   Depression   Essential hypertension   Thyroid disease   Hormone replacement therapy   SBO -NPO - continue NG tube to wall suction   Anxiety -Prozac 20 mg QHS when patient able to take PO -Increase Ativan IV 0.5 mg QHS,   Depression - Mirtazapine when patient able to take PO  Essential hypertension -Labetalol IV 5 mg q 2hr PRN SBP>150  Thyroid disease -Synthroid IV 12.5  g M/W/Th/Fri/Sun -Obtain TSH level  Hormone replacement therapy -Estradiol patch 0.05 mg/24 hr   Code Status: Full Family Communication: None Disposition Plan: Per GI  Time spent: 60 minutes  Drema Dallas [facility] Pager [phone]  If 7PM-7AM, please contact night-coverage [email or web address] Password [email or web address] 08/15/2014, 1:29 AM

## 2014-08-16 ENCOUNTER — Inpatient Hospital Stay (HOSPITAL_COMMUNITY): Payer: Medicare PPO

## 2014-08-16 NOTE — Progress Notes (Signed)
Pt woke up and pulled out her ng tube before she realized what she was doing. Pt got very anxious at the idea of putting another ng tube down at this point. She got so anxious about it that she said she may have a heart attack. Hospitalist on call notified. Ordered to leave ng tube out for now and monitor pt.  Jody Summers, Jody Summers

## 2014-08-16 NOTE — Progress Notes (Addendum)
Patient ID: Jody Summers, female   DOB: [birth year], 74 y.o.   MRN: [id number] [facility] PROGRESS NOTE  LAKEYN DOKKEN WUJ:[id number] DOB: 1941-03-25 DOA: 08/14/2014 PCP: Gwen Pounds, MD  Brief narrative:    74 year old female with history of hypertension, anxiety and depression, previous abdominal surgeries (hysterectomy, cholecystectomy) who presented to [facility] ED with worsening abdominal pain and distention for past few days prior to this admission associated with non bloody vomiting.  On admission, pt hemodynamically stable. CT abdomen showed SBO likely due to pelvic adhesions. NG tube placed on admission and surgery on board.  Assessment/Plan:    Principal Problem:   SBO (small bowel obstruction) - in pt with previous abdominal surgeries and SBO likely due to pelvic adhesions  - NG tube placed on admission - she pulled out the NG tube early am 08/16/2014 - repeat abd x ray today still shows SBO - surgery following - pt less nauseas this am and abd softly distended; will follow up on surgery recommendations   Active Problems:   Anxiety and Depression - continue lorazepam 0.5 mg IV at bedtime    Essential hypertension - BP at goal, 115/74    Hypothyroidism - continue synthroid 12.5 mg IV daily   DVT Prophylaxis  - SCD's bilaterally    Code Status: Full.  Family Communication:  plan of care discussed with the patient and her husband at the bedside  Disposition Plan: Home when stable. Still acute with SBO.  IV access:  Peripheral IV  Procedures and diagnostic studies:    Ct Abdomen Pelvis W Contrast 08/14/2014  1. CT findings consistent with a small-bowel obstruction likely due to pelvic adhesions. 2. Dilatation of the mid appendix which is fluid-filled. This may be due to fluid in the cecum. I do not see any significant periappendiceal inflammatory change but recommend correlation with clinical findings and close clinical followup.   Electronically Signed   By: Loralie Champagne M.D.   On: 08/14/2014 23:56   Dg Abd 2 Views 08/16/2014   Dilated loops of small bowel in the central abdomen, compatible with partial small bowel obstruction.  Contrast in the descending and sigmoid colon.    Dg Abd 2 Views 08/15/2014   1. Persistent small bowel obstruction without significant interval change. 2. Well-positioned nasogastric tube.   Electronically Signed   By: Malachy Moan M.D.   On: 08/15/2014 10:28    Medical Consultants:  Surgery   Other Consultants:  None   IAnti-Infectives:   None    Manson Passey, MD  [facility] Pager [phone]  If 7PM-7AM, please contact night-coverage [email or web address] Password [email or web address] 08/16/2014, 12:08 PM   LOS: 2 days    HPI/Subjective: No acute overnight events.  Objective: Filed Vitals:   08/15/14 0500 08/15/14 1538 08/15/14 2143 08/16/14 0613  BP: 131/59 118/47 117/56 115/74  Pulse: 91 95 92 92  Temp: 98.4 F (36.9 C) 97.6 F (36.4 C) 99.7 F (37.6 C) 99.5 F (37.5 C)  TempSrc: Oral Oral Oral Oral  Resp: Height:      Weight:      SpO2: 96% 96% 96% 98%    Intake/Output Summary (Last 24 hours) at 08/16/14 1208 Last data filed at 08/16/14 1005  Gross per 24 hour  Intake   1700 ml  Output    250 ml  Net   1450 ml    Exam:   General:  Pt is alert, follows commands appropriately, not in acute  distress  Cardiovascular: Regular rate and rhythm, S1/S2 (+)  Respiratory: Clear to auscultation bilaterally, no wheezing, no crackles, no rhonchi  Abdomen: Soft, distended, bowel sounds present  Extremities: No edema, pulses DP and PT palpable bilaterally  Neuro: Grossly nonfocal  Data Reviewed: Basic Metabolic Panel:  Recent Labs Lab 08/14/14 1938 08/15/14 0534  NA 137 139  K 4.0 3.7  CL 104 106  CO2 25 23  GLUCOSE 125* 113*  BUN 20 17  CREATININE 0.83 0.76  CALCIUM 8.7 8.4   Liver Function Tests:  Recent Labs Lab 08/14/14 1938 08/15/14 0534  AST 30 26  ALT 25 23  ALKPHOS  50 51  BILITOT 1.1 1.3*  PROT 7.5 6.9  ALBUMIN 4.0 3.7    Recent Labs Lab 08/14/14 1938 08/15/14 0534  LIPASE 20 18   No results for input(s): AMMONIA in the last 168 hours. CBC:  Recent Labs Lab 08/14/14 1938 08/15/14 0534  WBC 10.4 9.8  NEUTROABS 8.7* 8.1*  HGB 13.0 12.3  HCT 38.9 36.6  MCV 88.8 88.6  PLT 209 189   Cardiac Enzymes: No results for input(s): CKTOTAL, CKMB, CKMBINDEX, TROPONINI in the last 168 hours. BNP: Invalid input(s): POCBNP CBG: No results for input(s): GLUCAP in the last 168 hours.  No results found for this or any previous visit (from the past 240 hour(s)).   Scheduled Meds: . [START ON 08/17/2014] estradiol  0.05 mg Transdermal Weekly  . levothyroxine  12.5 mcg Intravenous Once per day on Sun Mon Wed Thu Fri  . LORazepam  0.5 mg Intravenous QHS   Continuous Infusions: . sodium chloride 125 mL/hr (08/16/14 1203)

## 2014-08-16 NOTE — Progress Notes (Signed)
Subjective: She got confused during the night and pulled ng out. She says she feels less bloated  Objective: Vital signs in last 24 hours: Temp:  [97.6 F (36.4 C)-99.7 F (37.6 C)] 99.5 F (37.5 C) (02/07 0613) Pulse Rate:  [92-95] 92 (02/07 0613) Resp:  [5-16] 5 (02/07 0613) BP: (115-118)/(47-74) 115/74 mmHg (02/07 0613) SpO2:  [96 %-98 %] 98 % (02/07 0613) Last BM Date: 08/14/14  Intake/Output from previous day: 02/06 0701 - 02/07 0700 In: 1900 [I.V.:1900] Out: 250 [Emesis/NG output:250] Intake/Output this shift:    Resp: clear to auscultation bilaterally Cardio: regular rate and rhythm GI: soft, mild distension. few bs. nontender  Lab Results:   Recent Labs  08/14/14 1938 08/15/14 0534  WBC 10.4 9.8  HGB 13.0 12.3  HCT 38.9 36.6  PLT 209 189   BMET  Recent Labs  08/14/14 1938 08/15/14 0534  NA 137 139  K 4.0 3.7  CL 104 106  CO2 25 23  GLUCOSE 125* 113*  BUN 20 17  CREATININE 0.83 0.76  CALCIUM 8.7 8.4   PT/INR No results for input(s): LABPROT, INR in the last 72 hours. ABG No results for input(s): PHART, HCO3 in the last 72 hours.  Invalid input(s): PCO2, PO2  Studies/Results: Ct Abdomen Pelvis W Contrast  08/14/2014   CLINICAL DATA:  Two-day history of abdominal pain with diarrhea and vomiting.  EXAM: CT ABDOMEN AND PELVIS WITH CONTRAST  TECHNIQUE: Multidetector CT imaging of the abdomen and pelvis was performed using the standard protocol following bolus administration of intravenous contrast.  CONTRAST:  OMNIPAQUE IOHEXOL 300 MG/ML  SOLN  COMPARISON:  None.  FINDINGS: Lower chest: Dependent atelectasis and scarring changes. No pleural effusion. The heart is normal in size. No pericardial effusion. The distal esophagus is grossly normal. There is a small hiatal hernia.  Hepatobiliary: No focal hepatic lesions or intrahepatic biliary dilatation. The gallbladder is surgically absent. No common bowel duct dilatation.  Pancreas: Normal  Spleen:  Normal  Adrenals/Urinary Tract: Normal  Stomach/Bowel: The stomach and duodenum are unremarkable. The small bowel is dilated with air-fluid levels. Stable areas of caliber change in the pelvis with a decompressed/ normal-appearing terminal ileum. Findings most likely due to pelvic adhesions. The midportion of the appendix is dilated and fluid-filled. The proximal and distal aspects of the appendix appears normal in caliber. There is mild mucosal enhancement. No significant inflammatory changes.  Vascular/Lymphatic: No mesenteric or retroperitoneal mass or adenopathy. Small scattered lymph nodes are noted. The aorta is normal in caliber. The branch vessels are patent. The major venous structures are patent.  Reproductive: The uterus is surgically absent. The ovaries are not identified. The bladder is normal. No pelvic mass or adenopathy. There is a small amount of free pelvic fluid. No inguinal mass or adenopathy.  Other: No abdominal wall hernia or subcutaneous lesions  Musculoskeletal: No significant bony findings.  IMPRESSION: 1. CT findings consistent with a small-bowel obstruction likely due to pelvic adhesions. 2. Dilatation of the mid appendix which is fluid-filled. This may be due to fluid in the cecum. I do not see any significant periappendiceal inflammatory change but recommend correlation with clinical findings and close clinical followup.   Electronically Signed   By: Loralie Champagne M.D.   On: 08/14/2014 23:56   Dg Abd 2 Views  08/15/2014   CLINICAL DATA:  74 year old female with small bowel obstruction  EXAM: ABDOMEN - 2 VIEW  COMPARISON:  CT abdomen/ pelvis 08/14/2014  FINDINGS: Nasogastric tube well positioned  in the stomach. Persistent numerous loops of air-filled and dilated small bowel with multiple small air-fluid levels on the upright radiograph. Comparing to the scout radiograph from yesterday CT scan, there has been no significant interval change. The colon remains relatively decompressed.  Lung bases are clear. Stable cardiomegaly. Surgical clips in the right upper quadrant suggest prior cholecystectomy. Excreted contrast material opacifies the bladder. No acute osseous abnormality. 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  IMPRESSION: 1. Persistent small bowel obstruction without significant interval change. 2. Well-positioned nasogastric tube.   Electronically Signed   By: Malachy MoanHeath  McCullough M.D.   On: 08/15/2014 10:28    Anti-infectives: Anti-infectives    None      Assessment/Plan: s/p * No surgery found * will check abdominal xrays today and decide whether to replace ng or not  Continue bowel rest ambulate  LOS: 2 days    TOTH III,Shenelle Klas S 08/16/2014

## 2014-08-17 LAB — CBC
HCT: 33 % — ABNORMAL LOW (ref 36.0–46.0)
Hemoglobin: 10.7 g/dL — ABNORMAL LOW (ref 12.0–15.0)
MCH: 29.5 pg (ref 26.0–34.0)
MCHC: 32.4 g/dL (ref 30.0–36.0)
MCV: 90.9 fL (ref 78.0–100.0)
Platelets: 211 10*3/uL (ref 150–400)
RBC: 3.63 MIL/uL — AB (ref 3.87–5.11)
RDW: 13.4 % (ref 11.5–15.5)
WBC: 7.2 10*3/uL (ref 4.0–10.5)

## 2014-08-17 LAB — PROTIME-INR
INR: 1.16 (ref 0.00–1.49)
Prothrombin Time: 15 seconds (ref 11.6–15.2)

## 2014-08-17 LAB — HEMOGLOBIN AND HEMATOCRIT, BLOOD
HEMATOCRIT: 33.4 % — AB (ref 36.0–46.0)
Hemoglobin: 10.7 g/dL — ABNORMAL LOW (ref 12.0–15.0)

## 2014-08-17 LAB — ABO/RH: ABO/RH(D): A POS

## 2014-08-17 LAB — PREPARE RBC (CROSSMATCH)

## 2014-08-17 MED ORDER — SODIUM CHLORIDE 0.9 % IV SOLN: Freq: Once | INTRAVENOUS | Status: DC

## 2014-08-17 NOTE — Progress Notes (Signed)
Subjective: Passing gas and moving bowels.  No nausea.  Feels a little bloated.  Hungry.  Objective: Vital signs in last 24 hours: Temp:  [98.1 F (36.7 C)-98.6 F (37 C)] 98.1 F (36.7 C) (02/08 0349) Pulse Rate:  [68-78] 68 (02/08 0511) Resp:  [16-20] 20 (02/08 0349) BP: (124-134)/(42-58) 124/42 mmHg (02/08 0511) SpO2:  [93 %-94 %] 93 % (02/08 0349) Last BM Date: 08/16/14  Intake/Output from previous day: 02/07 0701 - 02/08 0700 In: 2250 [I.V.:2250] Out: 750 [Urine:750] Intake/Output this shift:    PE: General- In NAD Abdomen-soft, not tender, mild distension, bowel sounds present  Lab Results:   Recent Labs  08/15/14 0534 08/17/14 0435 08/17/14 0650  WBC 9.8  --  7.2  HGB 12.3 10.7* 10.7*  HCT 36.6 33.4* 33.0*  PLT 189  --  211   BMET  Recent Labs  08/14/14 1938 08/15/14 0534  NA 137 139  K 4.0 3.7  CL 104 106  CO2 25 23  GLUCOSE 125* 113*  BUN 20 17  CREATININE 0.83 0.76  CALCIUM 8.7 8.4   PT/INR  Recent Labs  08/17/14 0435  LABPROT 15.0  INR 1.16   Comprehensive Metabolic Panel:    Component Value Date/Time   NA 139 08/15/2014 0534   NA 137 08/14/2014 1938   K 3.7 08/15/2014 0534   K 4.0 08/14/2014 1938   CL 106 08/15/2014 0534   CL 104 08/14/2014 1938   CO2 23 08/15/2014 0534   CO2 25 08/14/2014 1938   BUN 17 08/15/2014 0534   BUN 20 08/14/2014 1938   CREATININE 0.76 08/15/2014 0534   CREATININE 0.83 08/14/2014 1938   GLUCOSE 113* 08/15/2014 0534   GLUCOSE 125* 08/14/2014 1938   CALCIUM 8.4 08/15/2014 0534   CALCIUM 8.7 08/14/2014 1938   AST 26 08/15/2014 0534   AST 30 08/14/2014 1938   ALT 23 08/15/2014 0534   ALT 25 08/14/2014 1938   ALKPHOS 51 08/15/2014 0534   ALKPHOS 50 08/14/2014 1938   BILITOT 1.3* 08/15/2014 0534   BILITOT 1.1 08/14/2014 1938   PROT 6.9 08/15/2014 0534   PROT 7.5 08/14/2014 1938   ALBUMIN 3.7 08/15/2014 0534   ALBUMIN 4.0 08/14/2014 1938     Studies/Results: Dg Abd 2 Views  08/16/2014    CLINICAL DATA:  Follow-up small bowel obstruction  EXAM: ABDOMEN - 2 VIEW  COMPARISON:  08/15/2014  FINDINGS: Multiple dilated loops of small bowel in the central abdomen, compatible with partial small biopsy obstruction.  Contrast in the descending and sigmoid colon.  Cholecystectomy clips.  IMPRESSION: Dilated loops of small bowel in the central abdomen, compatible with partial small bowel obstruction.  Contrast in the descending and sigmoid colon.   Electronically Signed   By: Charline BillsSriyesh  Krishnan M.D.   On: 08/16/2014 10:33   Dg Abd 2 Views  08/15/2014   CLINICAL DATA:  [age] year old female with small bowel obstruction  EXAM: ABDOMEN - 2 VIEW  COMPARISON:  CT abdomen/ pelvis 08/14/2014  FINDINGS: Nasogastric tube well positioned in the stomach. Persistent numerous loops of air-filled and dilated small bowel with multiple small air-fluid levels on the upright radiograph. Comparing to the scout radiograph from yesterday CT scan, there has been no significant interval change. The colon remains relatively decompressed. Lung bases are clear. Stable cardiomegaly. Surgical clips in the right upper quadrant suggest prior cholecystectomy. Excreted contrast material opacifies the bladder. No acute osseous abnormality. 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  IMPRESSION: 1.  Persistent small bowel obstruction without significant interval change. 2. Well-positioned nasogastric tube.   Electronically Signed   By: Malachy Moan M.D.   On: 08/15/2014 10:28    Anti-infectives: Anti-infectives    None      Assessment Principal Problem:   SBO (small bowel obstruction)-some clinical and radiographic improvement    LOS: 3 days   Plan: Try clear liquid diet.   Adolph Pollack 08/17/2014

## 2014-08-17 NOTE — Progress Notes (Signed)
Pt has been confused most of the night after taking her ativan. Pt pulled out her IV earlier in the night. Pt was alert and oriented before taking the ativan.

## 2014-08-17 NOTE — Progress Notes (Signed)
Patient ID: Jody Summers, female   DOB: 1941/05/22, 74 y.o.   MRN: [id number] [facility] PROGRESS NOTE  Jody Summers KVQ:[id number] DOB: 1941/05/22 DOA: 08/14/2014 PCP: Gwen PoundsUSSO,JOHN M, MD  Brief narrative:    74 year old female with history of hypertension, anxiety and depression, previous abdominal surgeries (hysterectomy, cholecystectomy) who [facility] with worsening abdominal pain and distention for past few days prior to this admission associated with non bloody vomiting.  On admission, pt hemodynamically stable. CT abdomen showed SBO likely due to pelvic adhesions. NG tube placed on admission and surgery on board.   Assessment/Plan:    Principal Problem:  SBO (small bowel obstruction) - Patient has had previous abdominal surgeries including hysterectomy and cholecystectomy. She presented with abdominal distention and tenderness secondary to pelvic adhesions as seen on CT abdomen. - Patient initially had NG tube she pulled it out early-morning 08/16/2014. Her abdominal x-ray 08/16/2014 showed small bowel obstruction. She however did not have any reports of nausea. Her diet was advanced to clear liquid today and so far she tolerates it okay. - Appreciate surgery following  Active Problems:  Anxiety and Depression - will continue lorazepam 0.5 mg IV at bedtime   Essential hypertension - BP reasonably well controlled in hospital   Hypothyroidism - will continue synthroid 12.5 mg IV daily   DVT Prophylaxis  - SCD's bilaterally    Code Status: Full.  Family Communication: plan of care discussed with the patient and her husband at the bedside  Disposition Plan: Home when stable. Still acute with SBO. Diet advanced to clear liquid today.   IV access:  Peripheral IV  Procedures and diagnostic studies:   Ct Abdomen Pelvis W Contrast 08/14/2014 1. CT findings consistent with a small-bowel obstruction likely due to pelvic adhesions. 2. Dilatation of  the mid appendix which is fluid-filled. This may be due to fluid in the cecum. I do not see any significant periappendiceal inflammatory change but recommend correlation with clinical findings and close clinical followup. Electronically Signed By: Loralie ChampagneMark Gallerani M.D. On: 08/14/2014 23:56   Dg Abd 2 Views 08/16/2014 Dilated loops of small bowel in the central abdomen, compatible with partial small bowel obstruction. Contrast in the descending and sigmoid colon.   Dg Abd 2 Views 08/15/2014 1. Persistent small bowel obstruction without significant interval change. 2. Well-positioned nasogastric tube. Electronically Signed By: Malachy MoanHeath McCullough M.D. On: 08/15/2014 10:28    Medical Consultants:  Surgery   Other Consultants:  None   IAnti-Infectives:   None     Manson PasseyEVINE, Rudean Icenhour, MD  [facility] Pager [phone]  If 7PM-7AM, please contact night-coverage [email or web address] Password [email or web address] 08/17/2014, 11:16 AM   LOS: 3 days    HPI/Subjective: No acute overnight events.  Objective: Filed Vitals:   08/16/14 1400 08/16/14 2105 08/17/14 0349 08/17/14 0511  BP: 126/58 128/55 134/54 124/42  Pulse: 73 78 76 68  Temp: 98.6 F (37 C) 98.2 F (36.8 C) 98.1 F (36.7 C)   TempSrc: Oral Oral Oral   Resp: 16 18 20    Height:      Weight:      SpO2: 94% 94% 93%     Intake/Output Summary (Last 24 hours) at 08/17/14 1116 Last data filed at 08/17/14 1000  Gross per 24 hour  Intake 2203.33 ml  Output    750 ml  Net 1453.33 ml    Exam:   General:  Pt is alert, follows commands appropriately, not in acute distress  Cardiovascular: RRR, appreciate S1,  S2  Respiratory: Bilateral air entry, no wheezing  Abdomen: Less distended this morning, soft, nontender, appreciate bowel sounds  Extremities: No edema, pulses DP and PT palpable bilaterally  Neuro: No focal deficits  Data Reviewed: Basic Metabolic Panel:  Recent Labs Lab 08/14/14 1938 08/15/14 0534  NA  137 139  K 4.0 3.7  CL 104 106  CO2 25 23  GLUCOSE 125* 113*  BUN 20 17  CREATININE 0.83 0.76  CALCIUM 8.7 8.4   Liver Function Tests:  Recent Labs Lab 08/14/14 1938 08/15/14 0534  AST 30 26  ALT 25 23  ALKPHOS 50 51  BILITOT 1.1 1.3*  PROT 7.5 6.9  ALBUMIN 4.0 3.7    Recent Labs Lab 08/14/14 1938 08/15/14 0534  LIPASE 20 18   No results for input(s): AMMONIA in the last 168 hours. CBC:  Recent Labs Lab 08/14/14 1938 08/15/14 0534 08/17/14 0435 08/17/14 0650  WBC 10.4 9.8  --  7.2  NEUTROABS 8.7* 8.1*  --   --   HGB 13.0 12.3 10.7* 10.7*  HCT 38.9 36.6 33.4* 33.0*  MCV 88.8 88.6  --  90.9  PLT 209 189  --  211   Cardiac Enzymes: No results for input(s): CKTOTAL, CKMB, CKMBINDEX, TROPONINI in the last 168 hours. BNP: Invalid input(s): POCBNP CBG: No results for input(s): GLUCAP in the last 168 hours.  No results found for this or any previous visit (from the past 240 hour(s)).   Scheduled Meds: . sodium chloride   Intravenous Once  . estradiol  0.05 mg Transdermal Weekly  . levothyroxine  12.5 mcg Intravenous Once per day on Sun Mon Wed Thu Fri  . LORazepam  0.5 mg Intravenous QHS   Continuous Infusions: . sodium chloride 100 mL/hr (08/17/14 4098)

## 2014-08-17 NOTE — Progress Notes (Signed)
Pt was assisted to the bathroom and there was a large amount of bright red blood coming from her rectum. Pt stated she has a history of hemorrhoids and has had this happen before. MD was notified. Will continue to monitor pt. Patsey BertholdLemons, Oluwatosin Higginson King

## 2014-08-17 NOTE — Progress Notes (Addendum)
Shift Event RN paged secondary to pt with with large amount of BRBPR when using bathroom, pt with sob earlier,  BP 134/54. Pt states she has hx of hemorrhoids, and this is consistent with her hemorrhoidal bleed. Record reviewed, pt in with SBO.  BRBPR -repeat Hgb 10.7;  PT/INR Normal; BP 124/42.   -Initiated Type and Screen, preparation of 1U or PRBC, transfusion pending repeat Hgb in am -repeat Hgb pending, CBC q 3 hours -?GI consult in am if indicated  Jody LevelSahar Summers [facility] [phone]

## 2014-08-18 MED ORDER — LABETALOL HCL 100 MG PO TABS
50.0000 mg | ORAL_TABLET | Freq: Two times a day (BID) | ORAL | Status: DC
  Administered 2014-08-18 – 2014-08-20 (×6): 50 mg via ORAL
  Filled 2014-08-18 (×8): qty 0.5

## 2014-08-18 MED ORDER — LORAZEPAM 2 MG/ML IJ SOLN: 0.5000 mg | Freq: Two times a day (BID) | INTRAMUSCULAR | Status: DC | PRN

## 2014-08-18 MED ORDER — LORAZEPAM 2 MG/ML IJ SOLN
0.5000 mg | Freq: Two times a day (BID) | INTRAMUSCULAR | Status: DC | PRN
Start: 2014-08-18 — End: 2014-08-29
  Administered 2014-08-19 – 2014-08-28 (×9): 0.5 mg via INTRAVENOUS
  Filled 2014-08-18 (×9): qty 1

## 2014-08-18 MED ORDER — FLUOXETINE HCL 20 MG PO CAPS
20.0000 mg | ORAL_CAPSULE | Freq: Every day | ORAL | Status: DC
  Administered 2014-08-18 – 2014-08-20 (×3): 20 mg via ORAL
  Filled 2014-08-18 (×4): qty 1

## 2014-08-18 NOTE — Progress Notes (Signed)
Patient ID: Jody Summers, female   DOB: [birth year], 74 y.o.   MRN: [id number] [facility] PROGRESS NOTE  Jody Summers [id number] DOB: 08/05/1940 DOA: 08/14/2014 PCP: Gwen Pounds, MD  Brief narrative:    74 year old female with history of hypertension, anxiety and depression, previous abdominal surgeries (hysterectomy, cholecystectomy) who presented to [facility] ED with worsening abdominal pain and distention for past few days prior to this admission associated with non bloody vomiting.  On admission, pt hemodynamically stable. CT abdomen showed SBO likely due to pelvic adhesions. NG tube placed on admission but patient pulled it out on the morning of 08/16/2014. Her diet was advanced to clear liquids but she still has some nausea and vomiting. Surgery on board.  Assessment/Plan:     Principal Problem:  SBO (small bowel obstruction) - Patient has had previous abdominal surgeries including hysterectomy and cholecystectomy. She presented with abdominal distention and tenderness secondary to pelvic adhesions seen on CT abdomen. - NG tube was placed on the admission but patient pulled it out on the morning of 08/16/2014. - Abdominal x-ray on 08/16/2014 showed small bowel obstruction. Per surgery, diet advanced to clear liquids. She did have one episode of vomiting in past 24 hours. - We'll follow-up with surgery recommendations.  Active Problems:  Anxiety and Depression - Had episodes of confusion last night. It started after receiving Ativan. Patient usually gets scheduled Ativan at home. It was placed on when necessary basis starting today. - Since patient is on clear liquid diet we will resume Prozac today.   Essential hypertension - Resume labetalol today.   Hypothyroidism - Continue synthroid 12.5 mg IV daily   DVT Prophylaxis  - SCD's bilaterally    Code Status: Full.  Family Communication: plan of care discussed with the patient and her husband at the bedside   Disposition Plan: Home when stable. Still nausea and vomiting after CLD. Pt pulled out her NG tube the night of 08/16/14.   IV access:  Peripheral IV  Procedures and diagnostic studies:   Ct Abdomen Pelvis W Contrast 08/14/2014 1. CT findings consistent with a small-bowel obstruction likely due to pelvic adhesions. 2. Dilatation of the mid appendix which is fluid-filled. This may be due to fluid in the cecum. I do not see any significant periappendiceal inflammatory change but recommend correlation with clinical findings and close clinical followup. Electronically Signed By: Loralie Champagne M.D. On: 08/14/2014 23:56   Dg Abd 2 Views 08/16/2014 Dilated loops of small bowel in the central abdomen, compatible with partial small bowel obstruction. Contrast in the descending and sigmoid colon.   Dg Abd 2 Views 08/15/2014 1. Persistent small bowel obstruction without significant interval change. 2. Well-positioned nasogastric tube. Electronically Signed By: Malachy Moan M.D. On: 08/15/2014 10:28    Medical Consultants:  Surgery   Other Consultants:  None   IAnti-Infectives:   None      Manson Passey, MD  [facility] Pager [phone]  If 7PM-7AM, please contact night-coverage [email or web address] Password [email or web address] 08/18/2014, 10:56 AM   LOS: 4 days    HPI/Subjective: No acute overnight events. Some confusion last night but alert and oriented this am.   Objective: Filed Vitals:   08/17/14 0511 08/17/14 1445 08/17/14 2110 08/18/14 0600  BP: 124/42 135/50 128/62 145/61  Pulse: 68 70 77 65  Temp:  98.4 F (36.9 C) 98.4 F (36.9 C) 97.9 F (36.6 C)  TempSrc:  Oral Oral Oral  Resp:  Height:  Weight:      SpO2:  95% 95% 95%    Intake/Output Summary (Last 24 hours) at 08/18/14 1056 Last data filed at 08/18/14 1052  Gross per 24 hour  Intake 3446.67 ml  Output      0 ml  Net 3446.67 ml    Exam:   General:  Pt is not  in acute distress  Cardiovascular: Regular rate and rhythm, S1/S2 appreciated  Respiratory: Bilateral air entry, no crackles or rhonchi  Abdomen: Firm to palpation, appreciate bowel sounds  Extremities: No edema, pulses DP and PT palpable bilaterally  Neuro: Alert and oriented to time, place and person  Data Reviewed: Basic Metabolic Panel:  Recent Labs Lab 08/14/14 1938 08/15/14 0534  NA 137 139  K 4.0 3.7  CL 104 106  CO2 25 23  GLUCOSE 125* 113*  BUN 20 17  CREATININE 0.83 0.76  CALCIUM 8.7 8.4   Liver Function Tests:  Recent Labs Lab 08/14/14 1938 08/15/14 0534  AST 30 26  ALT 25 23  ALKPHOS 50 51  BILITOT 1.1 1.3*  PROT 7.5 6.9  ALBUMIN 4.0 3.7    Recent Labs Lab 08/14/14 1938 08/15/14 0534  LIPASE 20 18   No results for input(s): AMMONIA in the last 168 hours. CBC:  Recent Labs Lab 08/14/14 1938 08/15/14 0534 08/17/14 0435 08/17/14 0650  WBC 10.4 9.8  --  7.2  NEUTROABS 8.7* 8.1*  --   --   HGB 13.0 12.3 10.7* 10.7*  HCT 38.9 36.6 33.4* 33.0*  MCV 88.8 88.6  --  90.9  PLT 209 189  --  211   Cardiac Enzymes: No results for input(s): CKTOTAL, CKMB, CKMBINDEX, TROPONINI in the last 168 hours. BNP: Invalid input(s): POCBNP CBG: No results for input(s): GLUCAP in the last 168 hours.  No results found for this or any previous visit (from the past 240 hour(s)).   Scheduled Meds: . estradiol  0.05 mg Transdermal Weekly  . levothyroxine  12.5 mcg Intravenous Once per day on Sun Mon Wed Thu Fri   Continuous Infusions: . sodium chloride 100 mL/hr at 08/18/14 0454

## 2014-08-18 NOTE — Progress Notes (Signed)
Dr. Elisabeth Pigeonevine in to see pt. Pt alert and oriented this am with husband at bedside as well as Recruitment consultantsafety sitter. Md aware of pt's confusion during night shift. Dr. Elisabeth Pigeonevine dismissed need for safety sitter at this time. Will reassess need as appropriate. Received order to change HS ativan to PRN.

## 2014-08-18 NOTE — Progress Notes (Signed)
  Subjective: She vomited once but has done well since that time.  +BM x 3 since yesterday.    Objective: Vital signs in last 24 hours: Temp:  [97.9 F (36.6 C)-98.4 F (36.9 C)] 97.9 F (36.6 C) (02/09 0600) Pulse Rate:  [65-77] 65 (02/09 0600) Resp:  [18-20] 20 (02/09 0600) BP: (128-145)/(50-62) 145/61 mmHg (02/09 0600) SpO2:  [95 %] 95 % (02/09 0600) Last BM Date: 08/16/14 960 PO recorded DIET:CLEARS 2 BM yesterday and 1 today so far. Afebrile, VSS CBC normal  Intake/Output from previous day: 02/08 0701 - 02/09 0700 In: 1823.8 [P.O.:960; I.V.:863.8] Out: -  Intake/Output this shift: Total I/O In: 2086.7 [I.V.:2086.7] Out: -   General appearance: alert, cooperative and no distress Resp: clear to auscultation bilaterally GI: soft still a bit distended according to the patient.  + BS, +BM  Lab Results:   Recent Labs  08/17/14 0435 08/17/14 0650  WBC  --  7.2  HGB 10.7* 10.7*  HCT 33.4* 33.0*  PLT  --  211    BMET No results for input(s): NA, K, CL, CO2, GLUCOSE, BUN, CREATININE, CALCIUM in the last 72 hours. PT/INR  Recent Labs  08/17/14 0435  LABPROT 15.0  INR 1.16     Recent Labs Lab 08/14/14 1938 08/15/14 0534  AST 30 26  ALT 25 23  ALKPHOS 50 51  BILITOT 1.1 1.3*  PROT 7.5 6.9  ALBUMIN 4.0 3.7     Lipase     Component Value Date/Time   LIPASE 18 08/15/2014 0534     Studies/Results: No results found.  Medications: . estradiol  0.05 mg Transdermal Weekly  . FLUoxetine  20 mg Oral QHS  . labetalol  50 mg Oral BID  . levothyroxine  12.5 mcg Intravenous Once per day on Sun Mon Wed Thu Fri    Assessment/Plan SBO, with hx of cholecystectomy, and abdominal hysterectomy Hypertension Hypothyroid Anxiety Depression HRT SCD for DVT prophylaxis.    Plan:  Increase her to full liquids and see how she does. Restart her PO meds per Medicine.  If she tolerates fulls today we will give her a soft diet tomorrow and she may be able to go  home if still having BM's.    LOS: 4 days    Bruce Churilla 08/18/2014

## 2014-08-19 ENCOUNTER — Inpatient Hospital Stay (HOSPITAL_COMMUNITY): Payer: Medicare PPO

## 2014-08-19 MED ORDER — ALBUTEROL SULFATE (2.5 MG/3ML) 0.083% IN NEBU
INHALATION_SOLUTION | RESPIRATORY_TRACT | Status: AC
  Administered 2014-08-19: 02:00:00
  Filled 2014-08-19: qty 3

## 2014-08-19 MED ORDER — ACETAMINOPHEN 325 MG PO TABS
650.0000 mg | ORAL_TABLET | ORAL | Status: DC | PRN
Start: 2014-08-19 — End: 2014-08-29
  Administered 2014-08-19 – 2014-08-26 (×2): 650 mg via ORAL
  Filled 2014-08-19: qty 2

## 2014-08-19 MED ORDER — ALBUTEROL SULFATE (2.5 MG/3ML) 0.083% IN NEBU
5.0000 mg | INHALATION_SOLUTION | Freq: Four times a day (QID) | RESPIRATORY_TRACT | Status: DC | PRN
  Administered 2014-08-19 – 2014-08-20 (×3): 5 mg via RESPIRATORY_TRACT
  Filled 2014-08-19 (×4): qty 6

## 2014-08-19 MED ORDER — ACETAMINOPHEN 325 MG PO TABS
650.0000 mg | ORAL_TABLET | ORAL | Status: DC | PRN
  Filled 2014-08-19: qty 2

## 2014-08-19 MED ORDER — ALBUTEROL SULFATE (2.5 MG/3ML) 0.083% IN NEBU: 5.0000 mg | INHALATION_SOLUTION | Freq: Once | RESPIRATORY_TRACT | Status: DC

## 2014-08-19 MED ORDER — HYDROCORTISONE 2.5 % RE CREA
TOPICAL_CREAM | Freq: Three times a day (TID) | RECTAL | Status: DC | PRN
  Filled 2014-08-19: qty 28.35

## 2014-08-19 MED ORDER — LEVOTHYROXINE SODIUM 25 MCG PO TABS
25.0000 ug | ORAL_TABLET | ORAL | Status: DC
  Administered 2014-08-19 – 2014-08-28 (×5): 25 ug via ORAL
  Filled 2014-08-19 (×9): qty 1

## 2014-08-19 NOTE — Progress Notes (Addendum)
Patient ID: Jody Summers, female   DOB: October 12, 1940, 74 y.o.   MRN: [id number] [facility] PROGRESS NOTE  Jody Summers [id number] DOB: October 12, 1940 DOA: 08/14/2014 PCP: Gwen PoundsUSSO,JOHN M, MD  Brief narrative:    74 year old female with history of hypertension, anxiety and depression, previous abdominal surgeries (hysterectomy, cholecystectomy) who [facility] with worsening abdominal pain and distention for past few days prior to this admission associated with non bloody vomiting.  On admission, pt hemodynamically stable. CT abdomen showed SBO likely due to pelvic adhesions. NG tube placed on admission but patient pulled it out on the morning of 08/16/2014. Her diet was advanced to clear liquids but she continues to experience intermittent vomiting even with clear liquid diet. She still feels her abdomen is distended but no significant pain. He surgeries on board.  Assessment/Plan:    Principal Problem:  SBO (small bowel obstruction) - Patient has had previous abdominal surgeries including hysterectomy and cholecystectomy. She presented with abdominal distention and tenderness secondary to pelvic adhesions seen on CT abdomen. - NG tube was placed on the admission but patient pulled it out on the morning of 08/16/2014. - Patient had abdominal x-ray 08/16/2014 which showed SBO. We will obtain abdominal x-ray this morning. - Appreciate surgery following. - Current diet: Clear liquid  Active Problems:  Anxiety and Depression - Much better overnight with no episodes of confusion. We restarted Prozac. Lorazepam was placed on as needed basis.    Bright blood per rectum - Will check CBC tomorrow am per patient request; likely hemorrhoidal bleed because patient does have history of bleeding hemorrhoids. - Patient is hemodynamically stable. No active bleeding this morning.   Essential hypertension - Continue labetalol 50 mg by mouth twice daily   Hypothyroidism - Continue  synthroid which we changed to by mouth regimen, 25 g daily    Shortness of breath without hypoxia - Patient was given albuterol nebulizer once which seemed to help. She feels better this morning. - Oxygen saturation 97% on room air   DVT Prophylaxis  - SCD's bilaterally    Code Status: Full.  Family Communication: plan of care discussed with the patient and her husband at the bedside  Disposition Plan: Abdomen still distended and episode of intermittent vomiting yesterday. We need abdominal x-ray this morning. Not yet stable for discharge. Hopefully in the next 24 hours.  IV access:  Peripheral IV  Procedures and diagnostic studies:    Ct Abdomen Pelvis W Contrast 08/14/2014 1. CT findings consistent with a small-bowel obstruction likely due to pelvic adhesions. 2. Dilatation of the mid appendix which is fluid-filled. This may be due to fluid in the cecum. I do not see any significant periappendiceal inflammatory change but recommend correlation with clinical findings and close clinical followup.   Dg Abd 2 Views 08/16/2014  Dilated loops of small bowel in the central abdomen, compatible with partial small bowel obstruction.  Contrast in the descending and sigmoid colon.    Dg Abd 2 Views 08/15/2014  1. Persistent small bowel obstruction without significant interval change. 2. Well-positioned nasogastric tube.      Medical Consultants:  Surgery  Other Consultants:  None   IAnti-Infectives:   None    Manson PasseyEVINE, Jasiah Elsen, MD  [facility] Pager [phone]  If 7PM-7AM, please contact night-coverage [email or web address] Password [email or web address] 08/19/2014, 10:09 AM   LOS: 4 days    HPI/Subjective: No acute overnight events.  Objective: Filed Vitals:   08/18/14 1300 08/18/14 2122 08/19/14 [id number] 08/19/14 [id number]  BP: 123/58 140/62 141/77 124/62  Pulse: 67 66 69 70  Temp: 97.8 F (36.6 C) 98.7 F (37.1 C) 98.7 F (37.1 C)   TempSrc: Oral Oral Oral   Resp: Height:       Weight:      SpO2: 95% 98% 97%     Intake/Output Summary (Last 24 hours) at 08/19/14 1009 Last data filed at 08/19/14 0330  Gross per 24 hour  Intake 2206.67 ml  Output    400 ml  Net 1806.67 ml    Exam:   General:  Pt is alert, she is not in acute distress  Cardiovascular: Regular rate and rhythm, S1/S2 appreciated  Respiratory: Bilateral air entry, no rhonchi, no wheezing  Abdomen: Abdomen nontender but firm to palpation, appreciate bowel sounds  Extremities: No edema, pulses DP and PT palpable bilaterally  Neuro: No focal deficits, alert and oriented to time, place and person  Data Reviewed: Basic Metabolic Panel:  Recent Labs Lab 08/14/14 1938 08/15/14 0534  NA 137 139  K 4.0 3.7  CL 104 106  CO2 25 23  GLUCOSE 125* 113*  BUN 20 17  CREATININE 0.83 0.76  CALCIUM 8.7 8.4   Liver Function Tests:  Recent Labs Lab 08/14/14 1938 08/15/14 0534  AST 30 26  ALT 25 23  ALKPHOS 50 51  BILITOT 1.1 1.3*  PROT 7.5 6.9  ALBUMIN 4.0 3.7    Recent Labs Lab 08/14/14 1938 08/15/14 0534  LIPASE 20 18   No results for input(s): AMMONIA in the last 168 hours. CBC:  Recent Labs Lab 08/14/14 1938 08/15/14 0534 08/17/14 0435 08/17/14 0650  WBC 10.4 9.8  --  7.2  NEUTROABS 8.7* 8.1*  --   --   HGB 13.0 12.3 10.7* 10.7*  HCT 38.9 36.6 33.4* 33.0*  MCV 88.8 88.6  --  90.9  PLT 209 189  --  211   Cardiac Enzymes: No results for input(s): CKTOTAL, CKMB, CKMBINDEX, TROPONINI in the last 168 hours. BNP: Invalid input(s): POCBNP CBG: No results for input(s): GLUCAP in the last 168 hours.  No results found for this or any previous visit (from the past 240 hour(s)).   Scheduled Meds: . albuterol  5 mg Nebulization Once  . estradiol  0.05 mg Transdermal Weekly  . FLUoxetine  20 mg Oral QHS  . labetalol  50 mg Oral BID  . levothyroxine  25 mcg Oral Once per day on Sun Mon Wed Thu Fri   Continuous Infusions: . sodium chloride 100 mL/hr at 08/18/14 0454

## 2014-08-19 NOTE — Progress Notes (Signed)
  Subjective: She seems to do OK with clears, she had an ice cream bar last PM and vomited it up.  She has hemorrhoid issues and she has O2 on for her sore throat and ? Breathing noises.  She still feels distended.  Objective: Vital signs in last 24 hours: Temp:  [97.8 F (36.6 C)-98.7 F (37.1 C)] 98.7 F (37.1 C) (02/10 0526) Pulse Rate:  [58-69] 69 (02/10 0526) Resp:  [18-20] 20 (02/10 0526) BP: (123-141)/(53-77) 141/77 mmHg (02/10 0526) SpO2:  [95 %-98 %] 97 % (02/10 0526) Last BM Date: 08/18/14 120 PO recorded  Full liquids 3 stools recorded. Afebrile, VSS Labs OK Intake/Output from previous day: 02/09 0701 - 02/10 0700 In: 2206.7 [P.O.:120; I.V.:2086.7] Out: 400 [Urine:400] Intake/Output this shift:    General appearance: alert, cooperative, no distress and unhappy. Resp: clear to auscultation bilaterally GI: soft, but still a distended some.  + BS and BM  Lab Results:   Recent Labs  08/17/14 0435 08/17/14 0650  WBC  --  7.2  HGB 10.7* 10.7*  HCT 33.4* 33.0*  PLT  --  211    BMET No results for input(s): NA, K, CL, CO2, GLUCOSE, BUN, CREATININE, CALCIUM in the last 72 hours. PT/INR  Recent Labs  08/17/14 0435  LABPROT 15.0  INR 1.16     Recent Labs Lab 08/14/14 1938 08/15/14 0534  AST 30 26  ALT 25 23  ALKPHOS 50 51  BILITOT 1.1 1.3*  PROT 7.5 6.9  ALBUMIN 4.0 3.7     Lipase     Component Value Date/Time   LIPASE 18 08/15/2014 0534     Studies/Results: No results found.  Medications: . albuterol  5 mg Nebulization Once  . estradiol  0.05 mg Transdermal Weekly  . FLUoxetine  20 mg Oral QHS  . labetalol  50 mg Oral BID  . levothyroxine  25 mcg Oral Once per day on Sun Mon Wed Thu Fri    Assessment/Plan SBO, with hx of cholecystectomy, and abdominal hysterectomy Hypertension Hypothyroid Anxiety Depression HRT SCD for DVT prophylaxis.   Plan:  I will send her down for films this AM.  Then work on care plan further after  that.   LOS: 4 days    Jody Summers 08/19/2014

## 2014-08-20 ENCOUNTER — Inpatient Hospital Stay (HOSPITAL_COMMUNITY): Payer: Medicare PPO

## 2014-08-20 LAB — BASIC METABOLIC PANEL
ANION GAP: 8 (ref 5–15)
BUN: 23 mg/dL (ref 6–23)
CO2: 28 mmol/L (ref 19–32)
Calcium: 8.2 mg/dL — ABNORMAL LOW (ref 8.4–10.5)
Chloride: 109 mmol/L (ref 96–112)
Creatinine, Ser: 0.62 mg/dL (ref 0.50–1.10)
GFR calc Af Amer: >90 mL/min (ref >90)
GFR calc non Af Amer: 86 mL/min — ABNORMAL LOW (ref >90)
Glucose, Bld: 121 mg/dL — ABNORMAL HIGH (ref 70–99)
POTASSIUM: 3.5 mmol/L (ref 3.5–5.1)
SODIUM: 145 mmol/L (ref 135–145)

## 2014-08-20 LAB — CBC
HCT: 30.3 % — ABNORMAL LOW (ref 36.0–46.0)
Hemoglobin: 9.8 g/dL — ABNORMAL LOW (ref 12.0–15.0)
MCH: 29.4 pg (ref 26.0–34.0)
MCHC: 32.3 g/dL (ref 30.0–36.0)
MCV: 91 fL (ref 78.0–100.0)
Platelets: 256 10*3/uL (ref 150–400)
RBC: 3.33 MIL/uL — ABNORMAL LOW (ref 3.87–5.11)
RDW: 14 % (ref 11.5–15.5)
WBC: 7 10*3/uL (ref 4.0–10.5)

## 2014-08-20 LAB — PROTIME-INR
INR: 1.36 (ref 0.00–1.49)
PROTHROMBIN TIME: 16.9 s — AB (ref 11.6–15.2)

## 2014-08-20 LAB — APTT: aPTT: 31 seconds (ref 24–37)

## 2014-08-20 MED ORDER — KCL IN DEXTROSE-NACL 20-5-0.9 MEQ/L-%-% IV SOLN
INTRAVENOUS | Status: DC
  Administered 2014-08-20 (×2): via INTRAVENOUS
  Filled 2014-08-20 (×4): qty 1000

## 2014-08-20 MED ORDER — MIRTAZAPINE 15 MG PO TABS
15.0000 mg | ORAL_TABLET | Freq: Every day | ORAL | Status: DC
  Administered 2014-08-20: 15 mg via ORAL
  Filled 2014-08-20 (×2): qty 1

## 2014-08-20 NOTE — Progress Notes (Signed)
  Subjective: She does not feel better, complains of her stomach and her throat from the NG she pulled out several day ago.  Objective: Vital signs in last 24 hours: Temp:  [99.1 F (37.3 C)-99.2 F (37.3 C)] 99.2 F (37.3 C) (02/10 2059) Pulse Rate:  [70-83] 80 (02/10 2219) Resp:  [18] 18 (02/10 2059) BP: (124-140)/(59-65) 140/65 mmHg (02/10 2219) SpO2:  [90 %-94 %] 92 % (02/10 2154) Last BM Date: 08/20/14 360 PO  Clear diet BM x 2 recorded. Afebrile, VSS Labs OK Film:  Still has allot of distension, but somewhat better than yesterday  Intake/Output from previous day: 02/10 0701 - 02/11 0700 In: 4673.3 [P.O.:360; I.V.:4313.3] Out: 550 [Urine:550] Intake/Output this shift:    General appearance: alert, cooperative and no physical distress.  Anxious and uncomfortable. GI: soft, distended, + BS, +BM  Lab Results:   Recent Labs  08/20/14 0524  WBC 7.0  HGB 9.8*  HCT 30.3*  PLT 256    BMET  Recent Labs  08/20/14 0524  NA 145  K 3.5  CL 109  CO2 28  GLUCOSE 121*  BUN 23  CREATININE 0.62  CALCIUM 8.2*   PT/INR  Recent Labs  08/20/14 0524  LABPROT 16.9*  INR 1.36     Recent Labs Lab 08/14/14 1938 08/15/14 0534  AST 30 26  ALT 25 23  ALKPHOS 50 51  BILITOT 1.1 1.3*  PROT 7.5 6.9  ALBUMIN 4.0 3.7     Lipase     Component Value Date/Time   LIPASE 18 08/15/2014 0534     Studies/Results: Dg Abd 2 Views  08/19/2014   CLINICAL DATA:  Small bowel obstruction.  EXAM: ABDOMEN - 2 VIEW  COMPARISON:  August 16, 2014.  FINDINGS: Status post cholecystectomy. No colonic dilatation is noted. Stable mildly dilated small bowel loops are noted centrally within the abdomen concerning for possible partial distal small bowel obstruction. No free air is noted.  IMPRESSION: Stable mildly dilated small bowel loops are noted concerning for possible distal small bowel obstruction.   Electronically Signed   By: Lupita RaiderJames  Green Jr, M.D.   On: 08/19/2014 10:40     Medications: . albuterol  5 mg Nebulization Once  . estradiol  0.05 mg Transdermal Weekly  . FLUoxetine  20 mg Oral QHS  . labetalol  50 mg Oral BID  . levothyroxine  25 mcg Oral Once per day on Sun Mon Wed Thu Fri    Assessment/Plan SBO, with hx of cholecystectomy, and abdominal hysterectomy Hypertension Hypothyroid Anxiety Depression HRT SCD for DVT prophylaxis.   Plan:  We are going to watch her for another 24 hours.  She doesn't want and NG or an operation.  Ice chips only today.  Recheck film in AM. Labs daily ordered    LOS: 5 days    Dontay Harm 08/20/2014

## 2014-08-20 NOTE — Progress Notes (Signed)
Patient ID: Jody Summers, female   DOB: [birth year], 74 y.o.   MRN: [id number] [facility] PROGRESS NOTE  Jody Summers WUJ:[id number] DOB: [birth year] DOA: 08/14/2014 PCP: Gwen PoundsUSSO,JOHN M, MD  Brief narrative:    74 year old female with history of hypertension, anxiety and depression, previous abdominal surgeries (hysterectomy, cholecystectomy) who [facility] with worsening abdominal pain and distention for past few days prior to this admission associated with non bloody vomiting.  On admission, pt hemodynamically stable. CT abdomen showed SBO likely due to pelvic adhesions. NG tube placed on admission but patient pulled it out on the morning of 08/16/2014. Her diet was advanced to clear liquids but she continues to experience intermittent vomiting even with clear liquid diet. She still feels her abdomen is distended but no significant pain. He surgeries on board.  Assessment/Plan:    Principal Problem:  SBO (small bowel obstruction) - Patient has had previous abdominal surgeries including hysterectomy and cholecystectomy. She presented with abdominal distention and tenderness secondary to pelvic adhesions seen on CT abdomen. Patient did have NG tube on the admission but pulled it out on 08/16/2014. - Subsequent abdominal x-rays done and this morning it shows possible improvement in small bowel obstruction. - Surgery on board  Active Problems:  Anxiety and Depression - Continue Prozac scheduled. Lorazepam as needed.    Bright blood per rectum - Apparently some blood per rectum. Hemoglobin is stable. No further episodes of bleeding noted.   Essential hypertension - Continue labetalol 50 mg by mouth twice daily blood pressure 140/75.Marland Kitchen.    Hypothyroidism - Continue synthroid which we changed to by mouth regimen, 25 g daily    Shortness of breath without hypoxia - Patient was given albuterol nebulizer once which seemed to helpNo respiratory distress.   DVT Prophylaxis   - SCD's bilaterally    Code Status: Full.  Family Communication: plan of care discussed with the patient and her husband at the bedside  Disposition Plan: Based on abdominal x-ray seems that partial small bowel obstruction is improving. Will watch for next 24 hours. Surgery is following.  IV access:  Peripheral IV  Procedures and diagnostic studies:    Ct Abdomen Pelvis W Contrast 08/14/2014 1. CT findings consistent with a small-bowel obstruction likely due to pelvic adhesions. 2. Dilatation of the mid appendix which is fluid-filled. This may be due to fluid in the cecum. I do not see any significant periappendiceal inflammatory change but recommend correlation with clinical findings and close clinical followup.   Dg Abd 2 Views 08/16/2014  Dilated loops of small bowel in the central abdomen, compatible with partial small bowel obstruction.  Contrast in the descending and sigmoid colon.    Dg Abd 2 Views 08/15/2014  1. Persistent small bowel obstruction without significant interval change. 2. Well-positioned nasogastric tube.     Dg Abd 2 Views 08/19/2014    Stable mildly dilated small bowel loops are noted concerning for possible distal small bowel obstruction.     Dg Abd 2 Views 08/20/2014   : Mild improvement in partial small bowel obstruction.    Medical Consultants:  Surgery  Other Consultants:  None   IAnti-Infectives:   None    Manson PasseyEVINE, Shadiyah Wernli, MD  [facility] Pager [phone]  If 7PM-7AM, please contact night-coverage [email or web address] Password [email or web address] 08/20/2014, 11:44 AM   LOS: 5 days    HPI/Subjective: No acute overnight events.  Objective: Filed Vitals:   08/19/14 2059 08/19/14 2154 08/19/14 2219 08/20/14 1100  BP: 128/59  140/65 140/75  Pulse: 74  80 77  Temp: 99.2 F (37.3 C)   98.5 F (36.9 C)  TempSrc: Oral   Oral  Resp: 18   18  Height:      Weight:      SpO2: 94% 92%  92%    Intake/Output Summary (Last 24 hours) at 08/20/14 1144 Last data  filed at 08/20/14 0600  Gross per 24 hour  Intake 4673.33 ml  Output    550 ml  Net 4123.33 ml    Exam:   General:  Pt is alert, follows commands appropriately, not in acute distress  Cardiovascular: Regular rate and rhythm, S1/S2, no murmurs  Respiratory: Clear to auscultation bilaterally, no wheezing, no crackles, no rhonchi  Abdomen: Distended, not very tender to palpation, bowel sounds present  Extremities: No edema, pulses DP and PT palpable bilaterally  Neuro: Grossly nonfocal  Data Reviewed: Basic Metabolic Panel:  Recent Labs Lab 08/14/14 1938 08/15/14 0534 08/20/14 0524  NA 137 139 145  K 4.0 3.7 3.5  CL 104 106 109  CO2 GLUCOSE 125* 113* 121*  BUN CREATININE 0.83 0.76 0.62  CALCIUM 8.7 8.4 8.2*   Liver Function Tests:  Recent Labs Lab 08/14/14 1938 08/15/14 0534  AST 30 26  ALT 25 23  ALKPHOS 50 51  BILITOT 1.1 1.3*  PROT 7.5 6.9  ALBUMIN 4.0 3.7    Recent Labs Lab 08/14/14 1938 08/15/14 0534  LIPASE 20 18   No results for input(s): AMMONIA in the last 168 hours. CBC:  Recent Labs Lab 08/14/14 1938 08/15/14 0534 08/17/14 0435 08/17/14 0650 08/20/14 0524  WBC 10.4 9.8  --  7.2 7.0  NEUTROABS 8.7* 8.1*  --   --   --   HGB 13.0 12.3 10.7* 10.7* 9.8*  HCT 38.9 36.6 33.4* 33.0* 30.3*  MCV 88.8 88.6  --  90.9 91.0  PLT 209 189  --  211 256   Cardiac Enzymes: No results for input(s): CKTOTAL, CKMB, CKMBINDEX, TROPONINI in the last 168 hours. BNP: Invalid input(s): POCBNP CBG: No results for input(s): GLUCAP in the last 168 hours.  No results found for this or any previous visit (from the past 240 hour(s)).   Scheduled Meds: . albuterol  5 mg Nebulization Once  . estradiol  0.05 mg Transdermal Weekly  . FLUoxetine  20 mg Oral QHS  . labetalol  50 mg Oral BID  . levothyroxine  25 mcg Oral Once per day on Sun Mon Wed Thu Fri   Continuous Infusions: . dextrose 5 % and 0.9 % NaCl with KCl 20 mEq/L 100 mL/hr  at 08/20/14 1111

## 2014-08-21 ENCOUNTER — Encounter (HOSPITAL_COMMUNITY): Payer: Self-pay | Admitting: Anesthesiology

## 2014-08-21 ENCOUNTER — Inpatient Hospital Stay (HOSPITAL_COMMUNITY): Payer: Medicare PPO | Admitting: Anesthesiology

## 2014-08-21 ENCOUNTER — Inpatient Hospital Stay (HOSPITAL_COMMUNITY): Payer: Medicare PPO

## 2014-08-21 ENCOUNTER — Encounter (HOSPITAL_COMMUNITY)
Admission: EM | Disposition: A | Discharge status: Home Health | Payer: Self-pay | Source: Home / Self Care | Attending: Internal Medicine

## 2014-08-21 DIAGNOSIS — G9340 Encephalopathy, unspecified: Secondary | ICD-10-CM

## 2014-08-21 DIAGNOSIS — R062 Wheezing: Secondary | ICD-10-CM

## 2014-08-21 HISTORY — PX: LAPAROTOMY: SHX154

## 2014-08-21 HISTORY — PX: LYSIS OF ADHESION: SHX5961

## 2014-08-21 LAB — BASIC METABOLIC PANEL
ANION GAP: 6 (ref 5–15)
BUN: 19 mg/dL (ref 6–23)
CHLORIDE: 114 mmol/L — AB (ref 96–112)
CO2: 29 mmol/L (ref 19–32)
Calcium: 8.1 mg/dL — ABNORMAL LOW (ref 8.4–10.5)
Creatinine, Ser: 0.49 mg/dL — ABNORMAL LOW (ref 0.50–1.10)
GFR calc Af Amer: >90 mL/min (ref >90)
GFR calc non Af Amer: >90 mL/min (ref >90)
Glucose, Bld: 140 mg/dL — ABNORMAL HIGH (ref 70–99)
Potassium: 3.3 mmol/L — ABNORMAL LOW (ref 3.5–5.1)
Sodium: 149 mmol/L — ABNORMAL HIGH (ref 135–145)

## 2014-08-21 LAB — BLOOD GAS, ARTERIAL
Acid-Base Excess: 1.3 mmol/L (ref 0.0–2.0)
Bicarbonate: 25.4 mEq/L — ABNORMAL HIGH (ref 20.0–24.0)
Drawn by: 295031
FIO2: 0.4 %
O2 SAT: 93.8 %
PEEP: 5 cmH2O
PO2 ART: 69 mmHg — AB (ref 80.0–100.0)
PRESSURE SUPPORT: 5 cmH2O
Patient temperature: 37
TCO2: 23.6 mmol/L (ref 0–100)
pCO2 arterial: 40.3 mmHg (ref 35.0–45.0)
pH, Arterial: 7.416 (ref 7.350–7.450)

## 2014-08-21 LAB — CBC
HCT: 31.3 % — ABNORMAL LOW (ref 36.0–46.0)
HEMOGLOBIN: 9.9 g/dL — AB (ref 12.0–15.0)
MCH: 29 pg (ref 26.0–34.0)
MCHC: 31.6 g/dL (ref 30.0–36.0)
MCV: 91.8 fL (ref 78.0–100.0)
PLATELETS: 265 10*3/uL (ref 150–400)
RBC: 3.41 MIL/uL — ABNORMAL LOW (ref 3.87–5.11)
RDW: 14.2 % (ref 11.5–15.5)
WBC: 7.3 10*3/uL (ref 4.0–10.5)

## 2014-08-21 LAB — TYPE AND SCREEN
ABO/RH(D): A POS
Antibody Screen: NEGATIVE
Unit division: 0

## 2014-08-21 LAB — SURGICAL PCR SCREEN
MRSA, PCR: NEGATIVE
STAPHYLOCOCCUS AUREUS: NEGATIVE

## 2014-08-21 LAB — PREPARE RBC (CROSSMATCH)

## 2014-08-21 LAB — MAGNESIUM: Magnesium: 1.9 mg/dL (ref 1.5–2.5)

## 2014-08-21 SURGERY — LAPAROTOMY, EXPLORATORY
Anesthesia: General | Site: Abdomen

## 2014-08-21 MED ORDER — FENTANYL CITRATE 0.05 MG/ML IJ SOLN: 50.0000 ug | INTRAMUSCULAR | Status: DC | PRN

## 2014-08-21 MED ORDER — GLYCOPYRROLATE 0.2 MG/ML IJ SOLN
INTRAMUSCULAR | Status: AC
  Filled 2014-08-21: qty 3

## 2014-08-21 MED ORDER — FENTANYL CITRATE 0.05 MG/ML IJ SOLN
INTRAMUSCULAR | Status: DC | PRN
  Administered 2014-08-21: 50 ug via INTRAVENOUS
  Administered 2014-08-21: 100 ug via INTRAVENOUS
  Administered 2014-08-21 (×2): 50 ug via INTRAVENOUS

## 2014-08-21 MED ORDER — CEFAZOLIN SODIUM-DEXTROSE 2-3 GM-% IV SOLR
INTRAVENOUS | Status: AC
  Filled 2014-08-21: qty 50

## 2014-08-21 MED ORDER — ACETAMINOPHEN 10 MG/ML IV SOLN
1000.0000 mg | Freq: Once | INTRAVENOUS | Status: AC
  Administered 2014-08-21: 1000 mg via INTRAVENOUS
  Filled 2014-08-21: qty 100

## 2014-08-21 MED ORDER — KETAMINE HCL 10 MG/ML IJ SOLN
INTRAMUSCULAR | Status: AC
  Filled 2014-08-21: qty 1

## 2014-08-21 MED ORDER — SODIUM CHLORIDE 0.9 % IJ SOLN
INTRAMUSCULAR | Status: AC
  Filled 2014-08-21: qty 10

## 2014-08-21 MED ORDER — SODIUM CHLORIDE 0.9 % IV SOLN: Freq: Once | INTRAVENOUS | Status: DC

## 2014-08-21 MED ORDER — ALBUTEROL SULFATE HFA 108 (90 BASE) MCG/ACT IN AERS
INHALATION_SPRAY | RESPIRATORY_TRACT | Status: AC
  Filled 2014-08-21: qty 6.7

## 2014-08-21 MED ORDER — CISATRACURIUM BESYLATE 20 MG/10ML IV SOLN
INTRAVENOUS | Status: AC
  Filled 2014-08-21: qty 10

## 2014-08-21 MED ORDER — GLYCOPYRROLATE 0.2 MG/ML IJ SOLN
INTRAMUSCULAR | Status: DC | PRN
  Administered 2014-08-21: 0.6 mg via INTRAVENOUS

## 2014-08-21 MED ORDER — FENTANYL CITRATE 0.05 MG/ML IJ SOLN
INTRAMUSCULAR | Status: AC
  Filled 2014-08-21: qty 5

## 2014-08-21 MED ORDER — LIDOCAINE HCL (CARDIAC) 20 MG/ML IV SOLN
INTRAVENOUS | Status: DC | PRN
  Administered 2014-08-21: 100 mg via INTRAVENOUS

## 2014-08-21 MED ORDER — KCL IN DEXTROSE-NACL 40-5-0.45 MEQ/L-%-% IV SOLN
INTRAVENOUS | Status: DC
  Administered 2014-08-21 – 2014-08-28 (×7): via INTRAVENOUS
  Filled 2014-08-21 (×9): qty 1000

## 2014-08-21 MED ORDER — ONDANSETRON HCL 4 MG/2ML IJ SOLN
INTRAMUSCULAR | Status: DC | PRN
  Administered 2014-08-21: 4 mg via INTRAVENOUS

## 2014-08-21 MED ORDER — IPRATROPIUM-ALBUTEROL 0.5-2.5 (3) MG/3ML IN SOLN: 3.0000 mL | RESPIRATORY_TRACT | Status: DC | PRN

## 2014-08-21 MED ORDER — LACTATED RINGERS IV SOLN
INTRAVENOUS | Status: DC | PRN
  Administered 2014-08-21 (×2): via INTRAVENOUS

## 2014-08-21 MED ORDER — PROPOFOL 10 MG/ML IV BOLUS
INTRAVENOUS | Status: AC
  Filled 2014-08-21: qty 20

## 2014-08-21 MED ORDER — KETAMINE HCL 10 MG/ML IJ SOLN
INTRAMUSCULAR | Status: DC | PRN
  Administered 2014-08-21: 30 mg via INTRAVENOUS

## 2014-08-21 MED ORDER — PROPOFOL 10 MG/ML IV BOLUS
INTRAVENOUS | Status: DC | PRN
  Administered 2014-08-21: 30 mg via INTRAVENOUS
  Administered 2014-08-21: 90 mg via INTRAVENOUS

## 2014-08-21 MED ORDER — ONDANSETRON HCL 4 MG/2ML IJ SOLN
INTRAMUSCULAR | Status: AC
Start: 2014-08-21 — End: 2014-08-21
  Filled 2014-08-21: qty 2

## 2014-08-21 MED ORDER — CISATRACURIUM BESYLATE (PF) 10 MG/5ML IV SOLN
INTRAVENOUS | Status: DC | PRN
  Administered 2014-08-21: 6 mg via INTRAVENOUS

## 2014-08-21 MED ORDER — ALBUTEROL SULFATE HFA 108 (90 BASE) MCG/ACT IN AERS
INHALATION_SPRAY | RESPIRATORY_TRACT | Status: DC | PRN
  Administered 2014-08-21: 5 via RESPIRATORY_TRACT

## 2014-08-21 MED ORDER — IPRATROPIUM-ALBUTEROL 0.5-2.5 (3) MG/3ML IN SOLN
3.0000 mL | Freq: Four times a day (QID) | RESPIRATORY_TRACT | Status: DC
  Administered 2014-08-21 (×3): 3 mL via RESPIRATORY_TRACT
  Filled 2014-08-21 (×3): qty 3

## 2014-08-21 MED ORDER — EPHEDRINE SULFATE 50 MG/ML IJ SOLN
INTRAMUSCULAR | Status: AC
  Filled 2014-08-21: qty 1

## 2014-08-21 MED ORDER — METOPROLOL TARTRATE 1 MG/ML IV SOLN
INTRAVENOUS | Status: DC | PRN
  Administered 2014-08-21 (×2): 2.5 mg via INTRAVENOUS

## 2014-08-21 MED ORDER — DEXAMETHASONE SODIUM PHOSPHATE 10 MG/ML IJ SOLN
INTRAMUSCULAR | Status: AC
  Filled 2014-08-21: qty 1

## 2014-08-21 MED ORDER — KCL IN DEXTROSE-NACL 40-5-0.9 MEQ/L-%-% IV SOLN
INTRAVENOUS | Status: DC
  Administered 2014-08-21: 09:00:00 via INTRAVENOUS
  Filled 2014-08-21 (×4): qty 1000

## 2014-08-21 MED ORDER — DEXAMETHASONE SODIUM PHOSPHATE 10 MG/ML IJ SOLN
INTRAMUSCULAR | Status: DC | PRN
  Administered 2014-08-21: 10 mg via INTRAVENOUS

## 2014-08-21 MED ORDER — SODIUM CHLORIDE 0.9 % IR SOLN
Status: DC | PRN
  Administered 2014-08-21: 1

## 2014-08-21 MED ORDER — EPHEDRINE SULFATE 50 MG/ML IJ SOLN
INTRAMUSCULAR | Status: DC | PRN
  Administered 2014-08-21: 5 mg via INTRAVENOUS

## 2014-08-21 MED ORDER — SUCCINYLCHOLINE CHLORIDE 20 MG/ML IJ SOLN
INTRAMUSCULAR | Status: DC | PRN
  Administered 2014-08-21: 100 mg via INTRAVENOUS

## 2014-08-21 MED ORDER — LIDOCAINE HCL (CARDIAC) 20 MG/ML IV SOLN
INTRAVENOUS | Status: AC
  Filled 2014-08-21: qty 5

## 2014-08-21 MED ORDER — NEOSTIGMINE METHYLSULFATE 10 MG/10ML IV SOLN
INTRAVENOUS | Status: DC | PRN
  Administered 2014-08-21: 4 mg via INTRAVENOUS

## 2014-08-21 MED ORDER — IPRATROPIUM-ALBUTEROL 0.5-2.5 (3) MG/3ML IN SOLN
3.0000 mL | Freq: Three times a day (TID) | RESPIRATORY_TRACT | Status: DC
  Administered 2014-08-22 – 2014-08-24 (×9): 3 mL via RESPIRATORY_TRACT
  Filled 2014-08-21 (×9): qty 3

## 2014-08-21 MED ORDER — CEFAZOLIN SODIUM-DEXTROSE 2-3 GM-% IV SOLR
2.0000 g | Freq: Once | INTRAVENOUS | Status: AC
  Administered 2014-08-21: 2 g via INTRAVENOUS

## 2014-08-21 MED ORDER — FENTANYL CITRATE 0.05 MG/ML IJ SOLN
25.0000 ug | INTRAMUSCULAR | Status: DC | PRN
  Administered 2014-08-21 – 2014-08-24 (×15): 50 ug via INTRAVENOUS
  Administered 2014-08-24 – 2014-08-25 (×2): 25 ug via INTRAVENOUS
  Administered 2014-08-25: 50 ug via INTRAVENOUS
  Administered 2014-08-25: 25 ug via INTRAVENOUS
  Administered 2014-08-26 – 2014-08-27 (×2): 50 ug via INTRAVENOUS
  Filled 2014-08-21 (×21): qty 2

## 2014-08-21 MED ORDER — METOPROLOL TARTRATE 1 MG/ML IV SOLN
INTRAVENOUS | Status: AC
  Filled 2014-08-21: qty 5

## 2014-08-21 SURGICAL SUPPLY — 34 items
APPLICATOR COTTON TIP 6IN STRL (MISCELLANEOUS) ×3 IMPLANT
BLADE EXTENDED COATED 6.5IN (ELECTRODE) ×3 IMPLANT
BLADE HEX COATED 2.75 (ELECTRODE) ×3 IMPLANT
COVER MAYO STAND STRL (DRAPES) ×3 IMPLANT
DRAPE LAPAROSCOPIC ABDOMINAL (DRAPES) ×3 IMPLANT
DRAPE WARM FLUID 44X44 (DRAPE) ×3 IMPLANT
DRSG OPSITE POSTOP 4X10 (GAUZE/BANDAGES/DRESSINGS) ×3 IMPLANT
ELECT REM PT RETURN 9FT ADLT (ELECTROSURGICAL) ×3
ELECTRODE REM PT RTRN 9FT ADLT (ELECTROSURGICAL) ×2 IMPLANT
GAUZE SPONGE 4X4 12PLY STRL (GAUZE/BANDAGES/DRESSINGS) IMPLANT
GLOVE BIO SURGEON STRL SZ7.5 (GLOVE) ×6 IMPLANT
GLOVE BIOGEL PI IND STRL 7.0 (GLOVE) ×2 IMPLANT
GLOVE BIOGEL PI INDICATOR 7.0 (GLOVE) ×1
GOWN STRL REUS W/ TWL XL LVL3 (GOWN DISPOSABLE) ×4 IMPLANT
GOWN STRL REUS W/TWL LRG LVL3 (GOWN DISPOSABLE) ×3 IMPLANT
GOWN STRL REUS W/TWL XL LVL3 (GOWN DISPOSABLE) ×5 IMPLANT
KIT BASIN OR (CUSTOM PROCEDURE TRAY) ×3 IMPLANT
NS IRRIG 1000ML POUR BTL (IV SOLUTION) ×3 IMPLANT
PACK GENERAL/GYN (CUSTOM PROCEDURE TRAY) ×3 IMPLANT
SPONGE LAP 18X18 X RAY DECT (DISPOSABLE) ×3 IMPLANT
STAPLER VISISTAT 35W (STAPLE) ×3 IMPLANT
SUCTION POOLE TIP (SUCTIONS) ×3 IMPLANT
SUT PDS AB 1 CTX 36 (SUTURE) IMPLANT
SUT PDS AB 1 TP1 96 (SUTURE) ×6 IMPLANT
SUT SILK 2 0 (SUTURE) ×1
SUT SILK 2 0 SH CR/8 (SUTURE) ×3 IMPLANT
SUT SILK 2-0 18XBRD TIE 12 (SUTURE) ×2 IMPLANT
SUT SILK 3 0 (SUTURE)
SUT SILK 3 0 SH CR/8 (SUTURE) IMPLANT
SUT SILK 3-0 18XBRD TIE 12 (SUTURE) IMPLANT
TOWEL OR 17X26 10 PK STRL BLUE (TOWEL DISPOSABLE) ×6 IMPLANT
TRAY FOLEY CATH 14FRSI W/METER (CATHETERS) ×3 IMPLANT
WATER STERILE IRR 1500ML POUR (IV SOLUTION) ×3 IMPLANT
YANKAUER SUCT BULB TIP NO VENT (SUCTIONS) ×3 IMPLANT

## 2014-08-21 NOTE — Consult Note (Signed)
PULMONARY / CRITICAL CARE MEDICINE   Name: Jody Summers MRN: [id number] DOB: 1941-04-11    ADMISSION DATE:  08/14/2014 CONSULTATION DATE:  2/12  REFERRING MD :  Carolynne Edouard   CHIEF COMPLAINT:  Vent weaning   INITIAL PRESENTATION:  74 year old female who was admitted on 2/6 w/ CC 3d h/o abd pain, distention and associated nausea and vomiting. Has had 3 prior abd surgeries. Work up was c/w SBO. Was admitted for medical rx initially. Had no clinical improvement so went to OR for exploration on 2/12. Ended up requiring Lysis of adhesions which were felt to be the etiology of the obstruction. She returned to the PACU w/ report of hypoxia during transport from the OR table to her bed from recovery so she was kept on full vent support and transferred to the ICU for further evaluation.   STUDIES:    SIGNIFICANT EVENTS:    HISTORY OF PRESENT ILLNESS:  See above   PAST MEDICAL HISTORY :   has a past medical history of Thyroid disease.  has past surgical history that includes Cholecystectomy; Abdominal hysterectomy; and Thyroid surgery. Prior to Admission medications   Medication Sig Start Date End Date Taking? Authorizing Provider  aspirin 81 MG chewable tablet Chew 81 mg by mouth daily.   Yes Historical Provider, MD  Cholecalciferol (VITAMIN D3) 400 UNITS CAPS Take 400 mg by mouth daily.   Yes Historical Provider, MD  estradiol (VIVELLE-DOT) 0.05 MG/24HR patch Place 1 patch onto the skin once a week. Monday   Yes Historical Provider, MD  FLUoxetine (PROZAC) 20 MG capsule Take 20 mg by mouth at bedtime.   Yes Historical Provider, MD  labetalol (NORMODYNE) 100 MG tablet Take 50 mg by mouth 2 (two) times daily.   Yes Historical Provider, MD  levothyroxine (SYNTHROID, LEVOTHROID) 25 MCG tablet Take 25 mcg by mouth daily before breakfast. Monday Wednesday thursday Friday Sunday   Yes Historical Provider, MD  LORazepam (ATIVAN) 0.5 MG tablet Take 0.5 mg by mouth at bedtime.   Yes Historical Provider,  MD  MIRTAZAPINE PO Take 1 tablet by mouth at bedtime.    Yes Historical Provider, MD  multivitamin-iron-minerals-folic acid (CENTRUM) chewable tablet Chew 1 tablet by mouth daily.   Yes Historical Provider, MD  pyridoxine (B-6) 100 MG tablet Take 200 mg by mouth daily.   Yes Historical Provider, MD   Allergies  Allergen Reactions  . Reglan [Metoclopramide]     FAMILY HISTORY:  has no family status information on file.  SOCIAL HISTORY:  reports that she has never smoked. She does not have any smokeless tobacco history on file. She reports that she does not drink alcohol.  REVIEW OF SYSTEMS:  Unable   SUBJECTIVE:  Awake, not in distress on vent  VITAL SIGNS: Temp:  [98.1 F (36.7 C)-98.4 F (36.9 C)] 98.4 F (36.9 C) (02/12 0549) Pulse Rate:  [75-89] 77 (02/12 0549) Resp:  [18-20] 20 (02/12 0549) BP: (140-146)/(64-75) 146/75 mmHg (02/12 0549) SpO2:  [88 %-98 %] 88 % (02/12 0837) FiO2 (%):  [100 %] 100 % (02/12 1150) HEMODYNAMICS:   VENTILATOR SETTINGS: Vent Mode:  [-] PRVC FiO2 (%):  [100 %] 100 % Set Rate:  [14 bmp] 14 bmp Vt Set:  [500 mL] 500 mL PEEP:  [5 cmH20] 5 cmH20 Plateau Pressure:  [30 cmH20] 30 cmH20 INTAKE / OUTPUT:  Intake/Output Summary (Last 24 hours) at 08/21/14 1318 Last data filed at 08/21/14 1146  Gross per 24 hour  Intake 3731.67 ml  Output    900 ml  Net 2831.67 ml    PHYSICAL EXAMINATION: General:  No acute distress post-op on vent  Neuro:  Awake, alert, no focal def  HEENT:  NCAT, orally intubated.  Cardiovascular:  rrr Lungs:  occ rhonchi, prolonged exp wheeze  Abdomen:  Mid, abd dressing intact, bs hypoactive w/ expected post-operative tenderness  Musculoskeletal:  Intact  Skin:  Intact   LABS:  CBC  Recent Labs Lab 08/17/14 0650 08/20/14 0524 08/21/14 0508  WBC 7.2 7.0 7.3  HGB 10.7* 9.8* 9.9*  HCT 33.0* 30.3* 31.3*  PLT 211 256 265   Coag's  Recent Labs Lab 08/17/14 0435 08/20/14 0524  APTT  --  31  INR 1.16 1.36    BMET  Recent Labs Lab 08/15/14 0534 08/20/14 0524 08/21/14 0508  NA 139 145 149*  K 3.7 3.5 3.3*  CL 106 109 114*  CO2 23 28 29   BUN 17 23 19   CREATININE 0.76 0.62 0.49*  GLUCOSE 113* 121* 140*   Electrolytes  Recent Labs Lab 08/15/14 0534 08/20/14 0524 08/21/14 0508  CALCIUM 8.4 8.2* 8.1*  MG  --   --  1.9   Sepsis Markers No results for input(s): LATICACIDVEN, PROCALCITON, O2SATVEN in the last 168 hours. ABG No results for input(s): PHART, PCO2ART, PO2ART in the last 168 hours. Liver Enzymes  Recent Labs Lab 08/14/14 1938 08/15/14 0534  AST 30 26  ALT 25 23  ALKPHOS 50 51  BILITOT 1.1 1.3*  ALBUMIN 4.0 3.7   Cardiac Enzymes No results for input(s): TROPONINI, PROBNP in the last 168 hours. Glucose No results for input(s): GLUCAP in the last 168 hours.  Imaging Dg Abd 2 Views  08/20/2014   CLINICAL DATA:  Abdominal distention. Vomiting. Lower abdominal pain. Small bowel obstruction.  EXAM: ABDOMEN - 2 VIEW  COMPARISON:  08/19/2014  FINDINGS: Mild decrease in degree of small bowel dilatation seen compared to prior study. Some colonic gas is also noted. No evidence of free air. These findings are consistent with improving partial small bowel obstruction.  IMPRESSION: Mild improvement in partial small bowel obstruction.   Electronically Signed   By: Myles RosenthalJohn  Stahl M.D.   On: 08/20/2014 10:52     ASSESSMENT / PLAN:  PULMONARY OETT 2/12>>> A:  acute hypoxic respiratory failure Atelectasis  Wheezing: non-smoker, seemed like she had BD response prior to intubation.  P:   SBT  Careful w/ post op narcs Scheduled BDs Pulm hygiene s/p extubation Consider PFTs at some point to assess for obstructive airway disease   CARDIOVASCULAR CVL A: hypertension P:  Supportive care  Tele  Resume home meds when able   RENAL A:   Mild hypernatremia  MIld hyperchloremia  P:   Change IVFs to D5 1/2 40 MEq KCL  F/u chem    GASTROINTESTINAL A:   S/p  exploratory lap for SBO w/ lysis of adhesions  P:   Post-op care per surg Diet to adv per surg   HEMATOLOGIC A:   Anemia (NOS). No evidence of bleeding, but expect possible drift post-op.  P:  Trend cbc Transfuse per usual ICU guidelines PAS, but add [place] heparin when OK w/ surgery   INFECTIOUS A:  No acute  P:   Trend fever and WBC curve   ENDOCRINE A:   Mild hyperglycemia Hypothyroidism  P:   Trend glucose  Cont thyroid supplementation   NEUROLOGIC A:   Post-op pain  P:   RASS goal: -1 PRN analgesia  TODAY'S SUMMARY:  Post-exp lap for lysis of adhesions. Pre-op film showed basilar atx. She was awake on arrival so we placed her on SBT. I do note she does have some prolonged exp wheeze, but has not h/o smoking or h/o asthma. We will cont SBT, cont scheduled BDs, if passes will extubate later this afternoon. I think she might benefit from PFTs after recovery from her surgery (could be in the out-pt setting).   Simonne Martinet ACNP-BC [facility] [place] Pulmonary/Critical Care Pager # [phone] OR # [phone] if no answer       08/21/2014, 1:18 PM   Attending  I have seen and examined the patient with nurse practitioner/resident and agree with the note above.   SBO, lysis of adhesions today.  Likely just oversedated preventing extubatoin.  Though some wheezing on exam, CXR unremarkable.  Plan: extubation in ICU, bronchodilators, supportive care  Heber Chelan, MD [place] [facility] Pager: [phone] Cell: [phone] If no response, call [phone]

## 2014-08-21 NOTE — Transfer of Care (Signed)
Immediate Anesthesia Transfer of Care Note  Patient: Jody Summers  Procedure(s) Performed: Procedure(s): EXPLORATORY LAPAROTOMY (N/A) LYSIS OF ADHESION (N/A)  Patient Location: ICU  Anesthesia Type:General  Level of Consciousness: sedated and patient cooperative  Airway & Oxygen Therapy: Patient Spontanous Breathing and Patient remains intubated per anesthesia plan  Post-op Assessment: Report given to RN and Post -op Vital signs reviewed and stable  Post vital signs: Reviewed and stable  Last Vitals:  Filed Vitals:   08/21/14 0549  BP: 146/75  Pulse: 77  Temp: 36.9 C  Resp: 20    Complications: No apparent anesthesia complications

## 2014-08-21 NOTE — Progress Notes (Signed)
Pt arrived on unit around 1200 intubated. Per CCMs consult, Pt was extubated around 1430. [facility] paged twice to notify of pts extubation and to inquire about any new orders. No page was returned either time. Information passed along to night nurse. Vital signs stable at this time.

## 2014-08-21 NOTE — Op Note (Signed)
08/14/2014 - 08/21/2014  11:13 AM  PATIENT:  Jody Summers  74 y.o. female  PRE-OPERATIVE DIAGNOSIS:  Small bowel obstruction  POST-OPERATIVE DIAGNOSIS:  Small bowel obstruction  PROCEDURE:  Procedure(s): EXPLORATORY LAPAROTOMY (N/A) LYSIS OF ADHESION (N/A)  SURGEON:  Surgeon(s) and Role:    * Griselda MinerPaul Toth III, MD - Primary    * Abigail Miyamotoouglas Blackman, MD - Assisting  PHYSICIAN ASSISTANT:   ASSISTANTS: Dr. Magnus IvanBlackman   ANESTHESIA:   general  EBL:  Total I/O In: 1000 [I.V.:1000] Out: 300 [Urine:250; Blood:50]  BLOOD ADMINISTERED:none  DRAINS: none   LOCAL MEDICATIONS USED:  NONE  SPECIMEN:  No Specimen  DISPOSITION OF SPECIMEN:  N/A  COUNTS:  YES  TOURNIQUET:  * No tourniquets in log *  DICTATION: .Dragon Dictation  After informed consent was obtained the patient was brought to the operating room and placed in the supine position on the operating room table. After adequate induction of general anesthesia the patient's abdomen was prepped with ChloraPrep, allowed to dry, and draped in usual sterile manner. A midline incision was made with a 10 blade knife. This incision was carried through the skin and subcutaneous tissue sharply with electrocautery until the linea alba was identified. The linea alba was also incised with the electrocautery. The preperitoneal space was probed bluntly with a hemostat until the peritoneum was opened and access was gained to the abdominal cavity. The rest of the incision was opened under direct vision with the electrocautery. There was omentum stuck to the anterior abdominal wall around the incision and this was taken down sharply also with the electrocautery. Once this was accomplished we were able to run the small bowel from the ligament of Treitz to the ileocecal valve. In doing so there were 2 small bandlike adhesions that we were able to break up bluntly. These appeared to be the location of the obstruction. Once this was accomplished the entire small  bowel was free. The colon appeared healthy. The NG tube was confirmed in the good position in the stomach. At this point the abdomen was irrigated with copious amounts of saline. The small bowel was replaced in the abdominal cavity. The fascia of the anterior abdominal wall was closed with 2 running #1 double-stranded loop PDS sutures. The subcutaneous tissue was irrigated with copious amounts of saline. The skin was then closed with staples. Sterile dressings were applied. The patient tolerated the procedure well. At the end of the case all needle sponge and instrument counts were correct. The patient was then awakened and taken to recovery in stable condition.  PLAN OF CARE: Admit to inpatient   PATIENT DISPOSITION:  PACU - hemodynamically stable.   Delay start of Pharmacological VTE agent (>24hrs) due to surgical blood loss or risk of bleeding: no

## 2014-08-21 NOTE — Procedures (Signed)
Extubation Procedure Note  Patient Details:   Name: Jody Summers DOB: 17-Apr-1941 MRN: [id number]   Airway Documentation:     Evaluation  O2 sats: 94 Complications: none Patient tolerated procedure well. Bilateral Breath Sounds: Expiratory wheezes   Pt able to speak  Per CCM order, pt extubated, placed on 5L nasal cannula.  Pt tolerated well, no complications.  [name], [name] 08/21/2014, 2:40 PM

## 2014-08-21 NOTE — Progress Notes (Signed)
Pt transferred to OR via bed w/ OR staff, husband and Comptrollerstudent nurse .  Pt had CHG bath and PCR swabbing prior to transfer.  IV infiltrated but OR staff informed floor nurse to hold off restart

## 2014-08-21 NOTE — Addendum Note (Signed)
Addendum  created 08/21/14 1308 by Florene Routeiana L Mariann Palo, CRNA   Modules edited: Anesthesia Flowsheet

## 2014-08-21 NOTE — Anesthesia Procedure Notes (Signed)
Procedure Name: Intubation Date/Time: 08/21/2014 10:24 AM Performed by: Leroy LibmanEARDON, Jody Heigl L Patient Re-evaluated:Patient Re-evaluated prior to inductionOxygen Delivery Method: Circle system utilized Preoxygenation: Pre-oxygenation with 100% oxygen Intubation Type: IV induction, Cricoid Pressure applied and Rapid sequence Laryngoscope Size: Miller and 2 Grade View: Grade II Tube type: Subglottic suction tube Tube size: 7.5 mm Number of attempts: 1 Airway Equipment and Method: Stylet Placement Confirmation: ETT inserted through vocal cords under direct vision,  positive ETCO2 and breath sounds checked- equal and bilateral Secured at: 20 cm Tube secured with: Tape Dental Injury: Teeth and Oropharynx as per pre-operative assessment

## 2014-08-21 NOTE — Progress Notes (Signed)
Patient ID: Jody Summers, female   DOB: [birth year], 74 y.o.   MRN: [id number] [facility] PROGRESS NOTE  Jody Summers WUJ:[id number] DOB: [birth year] DOA: 08/14/2014 PCP: Gwen PoundsUSSO,JOHN M, MD  Brief narrative:    74 year old female with history of hypertension, anxiety and depression, previous abdominal surgeries (hysterectomy, cholecystectomy) who presented [facility] with worsening abdominal pain and distention for past few days prior to this admission associated with non bloody vomiting.  On admission, pt hemodynamically stable. CT abdomen showed SBO likely due to pelvic adhesions. NG tube placed on admission but patient pulled it out on the morning of 08/16/2014. Her diet was advanced to clear liquids but she continued to experience intermittent vomiting. Surgery is following and plan is for possible surgery if no significant improvement by today.   Assessment/Plan:     Principal Problem:  SBO (small bowel obstruction) - Patient has had previous abdominal surgeries including hysterectomy and cholecystectomy.  - Patient presented with abdominal tenderness and distention. The CAT scan on the admission showed pelvic adhesions which could account for small bowel obstruction. - Patient had NG tube placed on the admission. She pulled it out on 08/16/2014 early in the morning. Patient said she had a nightmare and accidentally pulled out NG tube. - Surgery is following. Diet was advanced to clear liquid but because of intermittent vomiting diet could not be advanced. Her abdomen remains distended and surgery anticipates that patient may require exploration if she doesn't feel better. We'll follow up on their recommendations today.  Active Problems:  Anxiety and Depression - Continue Prozac scheduled. Lorazepam as needed.   Bright blood per rectum - Apparently some blood per rectum per nursing staff on 08/16/2014. Hemoglobin is stable. No further episodes of bleeding noted.   Essential  hypertension - Continue labetalol 50 mg by mouth twice daily.   Hypothyroidism - Continue synthroid.   Shortness of breath without hypoxia - Wheezing on physical exam. Order placed for dual neb every 6 hours scheduled and every 4 hours as needed for shortness of breath or wheezing.   DVT Prophylaxis  - SCD's bilaterally    Code Status: Full.  Family Communication: plan of care discussed with the patient and her husband at the bedside  Disposition Plan: Needs abdominal x-ray this morning. Her abdomen is still distended and will follow-up with surgery if plan is for surgery during this hospital stay. Patient is not stable for discharge at this time.  IV access:  Peripheral IV  Procedures and diagnostic studies:    Ct Abdomen Pelvis W Contrast 08/14/2014 1. CT findings consistent with a small-bowel obstruction likely due to pelvic adhesions. 2. Dilatation of the mid appendix which is fluid-filled. This may be due to fluid in the cecum. I do not see any significant periappendiceal inflammatory change but recommend correlation with clinical findings and close clinical followup.   Dg Abd 2 Views 08/16/2014 Dilated loops of small bowel in the central abdomen, compatible with partial small bowel obstruction. Contrast in the descending and sigmoid colon.   Dg Abd 2 Views 08/15/2014 1. Persistent small bowel obstruction without significant interval change. 2. Well-positioned nasogastric tube.   Dg Abd 2 Views 08/19/2014 Stable mildly dilated small bowel loops are noted concerning for possible distal small bowel obstruction.   Dg Abd 2 Views 08/20/2014 : Mild improvement in partial small bowel obstruction.   Medical Consultants:  Surgery, Dr. Chevis PrettyPaul Toth  Other Consultants:  None   IAnti-Infectives:   None  Manson Passey, MD  [facility] Pager [phone]  If 7PM-7AM, please contact night-coverage [email or web address] Password [email or web address] 08/21/2014, 9:15 AM   LOS: 6  days    HPI/Subjective: No acute overnight events.  Objective: Filed Vitals:   08/20/14 2117 08/20/14 2121 08/21/14 0549 08/21/14 0837  BP: 145/64  146/75   Pulse: 75  77   Temp: 98.3 F (36.8 C)  98.4 F (36.9 C)   TempSrc: Oral  Oral   Resp: 18  20   Height:      Weight:      SpO2: 98% 97% 96% 88%    Intake/Output Summary (Last 24 hours) at 08/21/14 0915 Last data filed at 08/21/14 4540  Gross per 24 hour  Intake 2458.33 ml  Output    600 ml  Net 1858.33 ml    Exam:   General:  Pt is alert, follows commands appropriately, not in acute distress  Cardiovascular: Regular rate and rhythm, S1/S2, no murmurs  Respiratory: Wheezing in upper and mid lung lobes, no rhonchi  Abdomen: distended but non tender abdomen, bowel sounds present  Extremities: No edema, pulses DP and PT palpable bilaterally  Neuro: Grossly nonfocal  Data Reviewed: Basic Metabolic Panel:  Recent Labs Lab 08/14/14 1938 08/15/14 0534 08/20/14 0524 08/21/14 0508  NA 137 139 145 149*  K 4.0 3.7 3.5 3.3*  CL 104 106 109 114*  CO2 GLUCOSE 125* 113* 121* 140*  BUN CREATININE 0.83 0.76 0.62 0.49*  CALCIUM 8.7 8.4 8.2* 8.1*  MG  --   --   --  1.9   Liver Function Tests:  Recent Labs Lab 08/14/14 1938 08/15/14 0534  AST 30 26  ALT 25 23  ALKPHOS 50 51  BILITOT 1.1 1.3*  PROT 7.5 6.9  ALBUMIN 4.0 3.7    Recent Labs Lab 08/14/14 1938 08/15/14 0534  LIPASE 20 18   No results for input(s): AMMONIA in the last 168 hours. CBC:  Recent Labs Lab 08/14/14 1938 08/15/14 0534 08/17/14 0435 08/17/14 0650 08/20/14 0524 08/21/14 0508  WBC 10.4 9.8  --  7.2 7.0 7.3  NEUTROABS 8.7* 8.1*  --   --   --   --   HGB 13.0 12.3 10.7* 10.7* 9.8* 9.9*  HCT 38.9 36.6 33.4* 33.0* 30.3* 31.3*  MCV 88.8 88.6  --  90.9 91.0 91.8  PLT 209 189  --  211 256 265   Cardiac Enzymes: No results for input(s): CKTOTAL, CKMB, CKMBINDEX, TROPONINI in the last 168  hours. BNP: Invalid input(s): POCBNP CBG: No results for input(s): GLUCAP in the last 168 hours.  No results found for this or any previous visit (from the past 240 hour(s)).   Scheduled Meds: . albuterol  5 mg Nebulization Once  . estradiol  0.05 mg Transdermal Weekly  . FLUoxetine  20 mg Oral QHS  . ipratropium-albuterol  3 mL Nebulization Q6H  . labetalol  50 mg Oral BID  . levothyroxine  25 mcg Oral Once per day on Sun Mon Wed Thu Fri  . mirtazapine  15 mg Oral QHS   Continuous Infusions: . dextrose 5 % and 0.9 % NaCl with KCl 40 mEq/L 100 mL/hr at 08/21/14 0848

## 2014-08-21 NOTE — Anesthesia Postprocedure Evaluation (Signed)
Anesthesia Post Note  Patient: Jody Summers  Procedure(s) Performed: Procedure(s) (LRB): EXPLORATORY LAPAROTOMY (N/A) LYSIS OF ADHESION (N/A)  Anesthesia type: General  Patient location: ICU  Post pain: Pain level controlled  Post assessment: Post-op Vital signs reviewed  Post vital signs: stable  Level of consciousness: Patient remains intubated per anesthesia plan  Complications: No apparent anesthesia complications

## 2014-08-21 NOTE — Anesthesia Preprocedure Evaluation (Addendum)
Anesthesia Evaluation  Patient identified by MRN, date of birth, ID band Patient awake    Reviewed: Allergy & Precautions, NPO status , Patient's Chart, lab work & pertinent test results  History of Anesthesia Complications Negative for: history of anesthetic complications  Airway Mallampati: III  TM Distance: >3 FB Neck ROM: Full    Dental  (+) Poor Dentition, Dental Advisory Given   Pulmonary asthma ,    Pulmonary exam normal       Cardiovascular hypertension, Pt. on medications and Pt. on home beta blockers     Neuro/Psych PSYCHIATRIC DISORDERS Anxiety Depression negative neurological ROS     GI/Hepatic Neg liver ROS,   Endo/Other  negative endocrine ROS  Renal/GU negative Renal ROS     Musculoskeletal   Abdominal   Peds  Hematology   Anesthesia Other Findings   Reproductive/Obstetrics                            Anesthesia Physical Anesthesia Plan  ASA: III and emergent  Anesthesia Plan: General   Post-op Pain Management:    Induction: Intravenous, Rapid sequence and Cricoid pressure planned  Airway Management Planned: Oral ETT  Additional Equipment:   Intra-op Plan:   Post-operative Plan: Possible Post-op intubation/ventilation  Informed Consent: I have reviewed the patients History and Physical, chart, labs and discussed the procedure including the risks, benefits and alternatives for the proposed anesthesia with the patient or authorized representative who has indicated his/her understanding and acceptance.   Dental advisory given  Plan Discussed with: Anesthesiologist, CRNA and Surgeon  Anesthesia Plan Comments:        Anesthesia Quick Evaluation

## 2014-08-21 NOTE — Progress Notes (Signed)
  Subjective: No specific complaints. Had several small bm's overnight  Objective: Vital signs in last 24 hours: Temp:  [98.1 F (36.7 C)-98.5 F (36.9 C)] 98.4 F (36.9 C) (02/12 0549) Pulse Rate:  [75-89] 77 (02/12 0549) Resp:  [18-20] 20 (02/12 0549) BP: (140-146)/(64-75) 146/75 mmHg (02/12 0549) SpO2:  [84 %-98 %] 96 % (02/12 0549) Last BM Date: 08/20/14  Intake/Output from previous day: 02/11 0701 - 02/12 0700 In: 2458.3 [I.V.:2458.3] Out: 600 [Urine:600] Intake/Output this shift:    Resp: wheezes bilaterally Cardio: regular rate and rhythm GI: soft, nontender. mild distension. good bs  Lab Results:   Recent Labs  08/20/14 0524 08/21/14 0508  WBC 7.0 7.3  HGB 9.8* 9.9*  HCT 30.3* 31.3*  PLT 256 265   BMET  Recent Labs  08/20/14 0524 08/21/14 0508  NA 145 149*  K 3.5 3.3*  CL 109 114*  CO2 28 29  GLUCOSE 121* 140*  BUN 23 19  CREATININE 0.62 0.49*  CALCIUM 8.2* 8.1*   PT/INR  Recent Labs  08/20/14 0524  LABPROT 16.9*  INR 1.36   ABG No results for input(s): PHART, HCO3 in the last 72 hours.  Invalid input(s): PCO2, PO2  Studies/Results: Dg Abd 2 Views  08/20/2014   CLINICAL DATA:  Abdominal distention. Vomiting. Lower abdominal pain. Small bowel obstruction.  EXAM: ABDOMEN - 2 VIEW  COMPARISON:  08/19/2014  FINDINGS: Mild decrease in degree of small bowel dilatation seen compared to prior study. Some colonic gas is also noted. No evidence of free air. These findings are consistent with improving partial small bowel obstruction.  IMPRESSION: Mild improvement in partial small bowel obstruction.   Electronically Signed   By: Myles RosenthalJohn  Stahl M.D.   On: 08/20/2014 10:52   Dg Abd 2 Views  08/19/2014   CLINICAL DATA:  Small bowel obstruction.  EXAM: ABDOMEN - 2 VIEW  COMPARISON:  August 16, 2014.  FINDINGS: Status post cholecystectomy. No colonic dilatation is noted. Stable mildly dilated small bowel loops are noted centrally within the abdomen  concerning for possible partial distal small bowel obstruction. No free air is noted.  IMPRESSION: Stable mildly dilated small bowel loops are noted concerning for possible distal small bowel obstruction.   Electronically Signed   By: Lupita RaiderJames  Green Jr, M.D.   On: 08/19/2014 10:40    Anti-infectives: Anti-infectives    None      Assessment/Plan: s/p * No surgery found * will check abdominal xrays today and make decision about whether to explore or continue to observe  Will get cxr to evaluate wheezing  LOS: 6 days    TOTH III,PAUL S 08/21/2014

## 2014-08-21 NOTE — Progress Notes (Signed)
Patient ID: Jody Summers, female   DOB: 1941-07-09, 74 y.o.   MRN: [id number] xrays are unchanged. At this point i think she would benefit from exploration. I have discussed with her the risks and benefits of surgery as well as some of the technical aspects including the risk of bowel injury and need to be on vent and she understands and wishes to proceed

## 2014-08-22 ENCOUNTER — Encounter (HOSPITAL_COMMUNITY): Payer: Self-pay | Admitting: Internal Medicine

## 2014-08-22 DIAGNOSIS — E870 Hyperosmolality and hypernatremia: Secondary | ICD-10-CM | POA: Diagnosis present

## 2014-08-22 DIAGNOSIS — J9601 Acute respiratory failure with hypoxia: Secondary | ICD-10-CM

## 2014-08-22 DIAGNOSIS — E876 Hypokalemia: Secondary | ICD-10-CM | POA: Diagnosis not present

## 2014-08-22 LAB — COMPREHENSIVE METABOLIC PANEL
ALT: 53 U/L — ABNORMAL HIGH (ref 0–35)
ANION GAP: 5 (ref 5–15)
AST: 28 U/L (ref 0–37)
Albumin: 2.8 g/dL — ABNORMAL LOW (ref 3.5–5.2)
Alkaline Phosphatase: 50 U/L (ref 39–117)
BUN: 16 mg/dL (ref 6–23)
CALCIUM: 8 mg/dL — AB (ref 8.4–10.5)
CHLORIDE: 111 mmol/L (ref 96–112)
CO2: 29 mmol/L (ref 19–32)
Creatinine, Ser: 0.52 mg/dL (ref 0.50–1.10)
GFR calc Af Amer: >90 mL/min (ref >90)
GFR calc non Af Amer: >90 mL/min (ref >90)
Glucose, Bld: 131 mg/dL — ABNORMAL HIGH (ref 70–99)
Potassium: 3.8 mmol/L (ref 3.5–5.1)
Sodium: 145 mmol/L (ref 135–145)
TOTAL PROTEIN: 6.1 g/dL (ref 6.0–8.3)
Total Bilirubin: 0.5 mg/dL (ref 0.3–1.2)

## 2014-08-22 LAB — CBC
HCT: 33.7 % — ABNORMAL LOW (ref 36.0–46.0)
Hemoglobin: 10.9 g/dL — ABNORMAL LOW (ref 12.0–15.0)
MCH: 29.5 pg (ref 26.0–34.0)
MCHC: 32.3 g/dL (ref 30.0–36.0)
MCV: 91.1 fL (ref 78.0–100.0)
PLATELETS: 306 10*3/uL (ref 150–400)
RBC: 3.7 MIL/uL — AB (ref 3.87–5.11)
RDW: 14 % (ref 11.5–15.5)
WBC: 13.8 10*3/uL — AB (ref 4.0–10.5)

## 2014-08-22 MED ORDER — HEPARIN SODIUM (PORCINE) 5000 UNIT/ML IJ SOLN
5000.0000 [IU] | Freq: Three times a day (TID) | INTRAMUSCULAR | Status: DC
  Administered 2014-08-22 – 2014-08-29 (×21): 5000 [IU] via SUBCUTANEOUS
  Filled 2014-08-22 (×24): qty 1

## 2014-08-22 MED ORDER — POTASSIUM CHLORIDE IN NACL 20-0.45 MEQ/L-% IV SOLN: INTRAVENOUS | Status: DC

## 2014-08-22 NOTE — Consult Note (Signed)
PULMONARY / CRITICAL CARE MEDICINE   Name: Jody Summers MRN: [id number] DOB: September 30, 1940    ADMISSION DATE:  08/14/2014 CONSULTATION DATE:  2/12  REFERRING MD :  Carolynne Edouard   CHIEF COMPLAINT:  Vent weaning   INITIAL PRESENTATION:  74 year old female who was admitted on 2/6 w/ CC 3d h/o abd pain, distention and associated nausea and vomiting. Has had 3 prior abd surgeries. Work up was c/w SBO. Was admitted for medical rx initially. Had no clinical improvement so went to OR for exploration on 2/12. Ended up requiring Lysis of adhesions which were felt to be the etiology of the obstruction. She returned to the PACU w/ report of hypoxia during transport from the OR table to her bed from recovery so she was kept on full vent support and transferred to the ICU for further evaluation.   STUDIES:    SIGNIFICANT EVENTS:   SUBJECTIVE:  No events overnight.  VITAL SIGNS: Temp:  [98 F (36.7 C)-98.4 F (36.9 C)] 98.4 F (36.9 C) (02/13 0000) Pulse Rate:  [63-85] 79 (02/13 1000) Resp:  [15-27] 23 (02/13 1000) BP: (123-182)/(64-97) 160/65 mmHg (02/13 1000) SpO2:  [91 %-100 %] 95 % (02/13 1000) FiO2 (%):  [40 %-100 %] 40 % (02/12 1315) HEMODYNAMICS:   VENTILATOR SETTINGS: Vent Mode:  [-] Other (Comment) FiO2 (%):  [40 %-100 %] 40 % PEEP:  [5 cmH20] 5 cmH20 Pressure Support:  [5 cmH20] 5 cmH20 INTAKE / OUTPUT:  Intake/Output Summary (Last 24 hours) at 08/22/14 1205 Last data filed at 08/22/14 1100  Gross per 24 hour  Intake 2063.33 ml  Output   1070 ml  Net 993.33 ml    PHYSICAL EXAMINATION: General:  No acute distress post-op on vent  Neuro:  Awake, alert, no focal def  HEENT:  NCAT, orally intubated.  Cardiovascular:  rrr Lungs:  occ rhonchi, prolonged exp wheeze  Abdomen:  Mid, abd dressing intact, bs hypoactive w/ expected post-operative tenderness  Musculoskeletal:  Intact  Skin:  Intact   LABS:  CBC  Recent Labs Lab 08/20/14 0524 08/21/14 0508 08/22/14 0700  WBC  7.0 7.3 13.8*  HGB 9.8* 9.9* 10.9*  HCT 30.3* 31.3* 33.7*  PLT 256 265 306   Coag's  Recent Labs Lab 08/17/14 0435 08/20/14 0524  APTT  --  31  INR 1.16 1.36   BMET  Recent Labs Lab 08/20/14 0524 08/21/14 0508 08/22/14 0700  NA 145 149* 145  K 3.5 3.3* 3.8  CL 109 114* 111  CO2 BUN CREATININE 0.62 0.49* 0.52  GLUCOSE 121* 140* 131*   Electrolytes  Recent Labs Lab 08/20/14 0524 08/21/14 0508 08/22/14 0700  CALCIUM 8.2* 8.1* 8.0*  MG  --  1.9  --    Sepsis Markers No results for input(s): LATICACIDVEN, PROCALCITON, O2SATVEN in the last 168 hours. ABG  Recent Labs Lab 08/21/14 1408  PHART 7.416  PCO2ART 40.3  PO2ART 69.0*   Liver Enzymes  Recent Labs Lab 08/22/14 0700  AST 28  ALT 53*  ALKPHOS 50  BILITOT 0.5  ALBUMIN 2.8*   Cardiac Enzymes No results for input(s): TROPONINI, PROBNP in the last 168 hours. Glucose No results for input(s): GLUCAP in the last 168 hours.  Imaging Dg Chest 2 View  08/21/2014   CLINICAL DATA:  Nausea.  Weakness.  Shortness of breath  EXAM: CHEST  2 VIEW  COMPARISON:  None.  FINDINGS: Asymmetric, patchy airspace disease bilaterally. There are small  pleural effusions. No cardiomegaly. Negative aortic however contours.  Postoperative changes to the right neck and gallbladder fossa.  IMPRESSION: Asymmetric airspace disease favoring pneumonia. Small bilateral pleural effusions.   Electronically Signed   By: Marnee SpringJonathon  Watts M.D.   On: 08/21/2014 08:51   Dg Abd 2 Views  08/21/2014   CLINICAL DATA:  Nausea, mid abdominal pain, abdominal distention  EXAM: ABDOMEN - 2 VIEW  COMPARISON:  08/20/2014  FINDINGS: Continued mild decreased in the degree of the small bowel gaseous distention in the left abdomen with air-fluid levels. Air present in the colon. Negative for free air. Prior cholecystectomy noted. Heart is enlarged with bibasilar opacities, suspect atelectasis.  IMPRESSION: Resolving obstruction pattern.   No free air.   Electronically Signed   By: Judie PetitM.  Shick M.D.   On: 08/21/2014 08:48     ASSESSMENT / PLAN:  PULMONARY OETT 2/12>>> A:  acute hypoxic respiratory failure Atelectasis  Wheezing: non-smoker, seemed like she had BD response prior to intubation.  P:   Extubated and doing well Titrate O2 for sat of 88-92%  CARDIOVASCULAR CVL A: hypertension P:  Supportive care. Tele. Resume home meds when cleared by GI.  RENAL A:   Mild hypernatremia  MIld hyperchloremia  P:   Continue IVFs to D5 1/2 40 MEq KCL  F/u chem  Replace electrolytes as indicated. Watch for evidence of fluid overload.  GASTROINTESTINAL A:   S/p exploratory lap for SBO w/ lysis of adhesions  P:   Post-op care per surg Diet to adv per surg   HEMATOLOGIC A:   Anemia (NOS). No evidence of bleeding, but expect possible drift post-op.  P:  Trend cbc Transfuse per usual ICU guidelines PAS, but add [place] heparin when OK w/ surgery   INFECTIOUS A:  No acute  P:   Trend fever and WBC curve   ENDOCRINE A:   Mild hyperglycemia Hypothyroidism  P:   Trend glucose  Cont thyroid supplementation   NEUROLOGIC A:   Post-op pain  P:   RASS goal: -1 PRN analgesia   TODAY'S SUMMARY:  Post-exp lap for lysis of adhesions. Extubated 2/12, doing well from a respiratory standpoint.  PCCM will sign off, please call back if needed.  Alyson ReedyWesam G. Reizy Dunlow, M.D. [facility]. Pager: [phone]. After hours pager: [phone].

## 2014-08-22 NOTE — Progress Notes (Signed)
General Surgery Note  LOS: 7 days  POD -  1 Day Post-Op  PCP - Dr. Timothy Lassousso  Assessment/Plan: 1.  EXPLORATORY LAPAROTOMY, LYSIS OF ADHESION - 08/21/2014  In ICU - will leave here at least one more day  Cont NGT - NPO - recheck labs in AM  2.  Extubated - lung function looks good 3.  HTN 4.  Depression/anxiety  5.  DVT prophylaxis - on no chemoprophylaxis, will start SQ Heparin 6.  Foley in place   Principal Problem:   SBO (small bowel obstruction) Active Problems:   Anxiety   Depression   Essential hypertension   Thyroid disease   Hormone replacement therapy   Acute respiratory failure with hypoxia   Hypernatremia   Hypokalemia  Subjective:  Alert.  Doing okay.  Takes sleeping pill at home and not sleeping here.  But she has a lot of reasons not to be sleeping.  Husband, Jody Summers, is a patient of mine, at bedside. Objective:   Filed Vitals:   08/22/14 0600  BP: 182/94  Pulse: 78  Temp:   Resp: 23     Intake/Output from previous day:  02/12 0701 - 02/13 0700 In: 3455 [I.V.:3455] Out: 1230 [Urine:980; Emesis/NG output:200; Blood:50]  Intake/Output this shift:      Physical Exam:   General: WN older WF who is alert and oriented.    HEENT: Normal. Pupils equal.  Has NGT in place .   Lungs: Clear   Abdomen: Quiet   Wound: Clean   Lab Results:    Recent Labs  08/21/14 0508 08/22/14 0700  WBC 7.3 13.8*  HGB 9.9* 10.9*  HCT 31.3* 33.7*  PLT 265 306    BMET   Recent Labs  08/21/14 0508 08/22/14 0700  NA 149* 145  K 3.3* 3.8  CL 114* 111  CO2 29 29  GLUCOSE 140* 131*  BUN 19 16  CREATININE 0.49* 0.52  CALCIUM 8.1* 8.0*    PT/INR   Recent Labs  08/20/14 0524  LABPROT 16.9*  INR 1.36    ABG   Recent Labs  08/21/14 1408  PHART 7.416  HCO3 25.4*     Studies/Results:  Dg Chest 2 View  08/21/2014   CLINICAL DATA:  Nausea.  Weakness.  Shortness of breath  EXAM: CHEST  2 VIEW  COMPARISON:  None.  FINDINGS: Asymmetric, patchy airspace disease  bilaterally. There are small pleural effusions. No cardiomegaly. Negative aortic however contours.  Postoperative changes to the right neck and gallbladder fossa.  IMPRESSION: Asymmetric airspace disease favoring pneumonia. Small bilateral pleural effusions.   Electronically Signed   By: Marnee SpringJonathon  Watts M.D.   On: 08/21/2014 08:51   Dg Abd 2 Views  08/21/2014   CLINICAL DATA:  Nausea, mid abdominal pain, abdominal distention  EXAM: ABDOMEN - 2 VIEW  COMPARISON:  08/20/2014  FINDINGS: Continued mild decreased in the degree of the small bowel gaseous distention in the left abdomen with air-fluid levels. Air present in the colon. Negative for free air. Prior cholecystectomy noted. Heart is enlarged with bibasilar opacities, suspect atelectasis.  IMPRESSION: Resolving obstruction pattern.  No free air.   Electronically Signed   By: Judie PetitM.  Shick M.D.   On: 08/21/2014 08:48   Dg Abd 2 Views  08/20/2014   CLINICAL DATA:  Abdominal distention. Vomiting. Lower abdominal pain. Small bowel obstruction.  EXAM: ABDOMEN - 2 VIEW  COMPARISON:  08/19/2014  FINDINGS: Mild decrease in degree of small bowel dilatation seen compared to prior study.  Some colonic gas is also noted. No evidence of free air. These findings are consistent with improving partial small bowel obstruction.  IMPRESSION: Mild improvement in partial small bowel obstruction.   Electronically Signed   By: Myles Rosenthal M.D.   On: 08/20/2014 10:52     Anti-infectives:   Anti-infectives    Start     Dose/Rate Route Frequency Ordered Stop   08/21/14 1000  ceFAZolin (ANCEF) IVPB 2 g/50 mL premix     2 g 100 mL/hr over 30 Minutes Intravenous  Once 08/21/14 1039 08/21/14 1027      Ovidio Kin, MD, FACS Pager: [phone] [facility] Office: [phone] 08/22/2014

## 2014-08-22 NOTE — Progress Notes (Signed)
Progress Note   Jody Summers [id number] DOB: November 01, 1940 DOA: 08/14/2014 PCP: Gwen Pounds, MD   Brief Narrative:   Jody Summers is an 74 y.o. female was admitted on 08/15/14 for treatment of small bowel obstruction. She was initially treated with conservative management, but underwent an exploratory lap with lysis of adhesions on 08/21/14 secondary to lack of clinical improvement. She was hypoxic postoperatively and maintained on full ventilator support with subsequent successful weaning by PCCM.  Assessment/Plan:   Principal Problem:   SBO (small bowel obstruction)  Taken to OR for exploratory lap and lysis of adhesions on 08/21/14 after failing conservative management.  Currently on bowel rest.  Continue NG decompression.  Active Problems:   Acute hypoxic respiratory failure  Successfully weaned after requiring ventilator support postoperatively.    Hypernatremia  Continue hypotonic IV fluids.    Hypokalemia  Potassium added to IV fluids.    Normocytic anemia  Hemoglobin slowly trending down, anemic on admission. Monitor. No current indication for transfusion.    Anxiety/Depression  Prozac currently on hold. Continue when necessary IV Ativan.    Essential hypertension  Continue when necessary labetalol. Resume oral medications when diet advanced.    Thyroid disease  Continue Synthroid.    DVT prophylaxis/Hormone replacement therapy  On SCDs for DVT prophylaxis.   Code Status: Full. Family Communication: Husband updated at the bedside. Disposition Plan: Home when stable.   IV Access:    Peripheral IV   Procedures and diagnostic studies:   Ct Abdomen Pelvis W Contrast 08/14/2014 1. CT findings consistent with a small-bowel obstruction likely due to pelvic adhesions. 2. Dilatation of the mid appendix which is fluid-filled. This may be due to fluid in the cecum. I do not see any significant periappendiceal inflammatory change but recommend  correlation with clinical findings and close clinical followup.   Dg Abd 2 Views 08/15/2014 1. Persistent small bowel obstruction without significant interval change. 2. Well-positioned nasogastric tube.  Dg Abd 2 Views 08/16/2014 Dilated loops of small bowel in the central abdomen, compatible with partial small bowel obstruction. Contrast in the descending and sigmoid colon.   Dg Abd 2 Views 08/19/2014 Stable mildly dilated small bowel loops are noted concerning for possible distal small bowel obstruction.   Dg Abd 2 Views 08/20/2014 : Mild improvement in partial small bowel obstruction.    Medical Consultants:    Dr. Chevis Pretty, Surgery  Dr. Max Fickle, PCCM  Anti-Infectives:   None.  Subjective:    Jody Summers reports 7/10 abdominal pain, no flatus yet.  Some occasional SOB.  No cough.    Objective:    Filed Vitals:   08/22/14 0400 08/22/14 0600 08/22/14 0744 08/22/14 0800  BP: 159/64 182/94  149/72  Pulse: 67 78  85  Temp:      TempSrc:      Resp: Height:      Weight:      SpO2: 92% 96% 98% 96%    Intake/Output Summary (Last 24 hours) at 08/22/14 1001 Last data filed at 08/22/14 1000  Gross per 24 hour  Intake 3463.33 ml  Output   1300 ml  Net 2163.33 ml    Exam: Gen:  NAD Cardiovascular:  RRR, No M/R/G Respiratory:  Lungs CTAB but mildly tachypneic Gastrointestinal:  Abdomen tender, quiet  Extremities:  No C/E/C, SCDs in place   Data Reviewed:    Labs: Basic Metabolic Panel:  Recent Labs Lab 08/20/14 0524 08/21/14  [id number] 08/22/14 0700  NA 145 149* 145  K 3.5 3.3* 3.8  CL 109 114* 111  CO2 28 29 29   GLUCOSE 121* 140* 131*  BUN 23 19 16   CREATININE 0.62 0.49* 0.52  CALCIUM 8.2* 8.1* 8.0*  MG  --  1.9  --    GFR Estimated Creatinine Clearance: 52.5 mL/min (by C-G formula based on Cr of 0.52). Liver Function Tests:  Recent Labs Lab 08/22/14 0700  AST 28  ALT 53*  ALKPHOS 50  BILITOT 0.5  PROT 6.1    ALBUMIN 2.8*   Coagulation profile  Recent Labs Lab 08/17/14 0435 08/20/14 0524  INR 1.16 1.36    CBC:  Recent Labs Lab 08/17/14 0435 08/17/14 0650 08/20/14 0524 08/21/14 0508 08/22/14 0700  WBC  --  7.2 7.0 7.3 13.8*  HGB 10.7* 10.7* 9.8* 9.9* 10.9*  HCT 33.4* 33.0* 30.3* 31.3* 33.7*  MCV  --  90.9 91.0 91.8 91.1  PLT  --  211 256 265 306   Microbiology Recent Results (from the past 240 hour(s))  Surgical pcr screen     Status: None   Collection Time: 08/21/14  9:22 AM  Result Value Ref Range Status   MRSA, PCR NEGATIVE NEGATIVE Final   Staphylococcus aureus NEGATIVE NEGATIVE Final    Comment:        The Xpert SA Assay (FDA approved for NASAL specimens in patients over 76 years of age), is one component of a comprehensive surveillance program.  Test performance has been validated by [facility] for patients greater than or equal to 76 year old. It is not intended to diagnose infection nor to guide or monitor treatment.      Medications:   . albuterol  5 mg Nebulization Once  . estradiol  0.05 mg Transdermal Weekly  . heparin subcutaneous  5,000 Units Subcutaneous 3 times per day  . ipratropium-albuterol  3 mL Nebulization TID  . levothyroxine  25 mcg Oral Once per day on Sun Mon Wed Thu Fri   Continuous Infusions: . dextrose 5 % and 0.45 % NaCl with KCl 40 mEq/L 100 mL/hr at 08/22/14 0945    Time spent: 35 minutes with > 50% of time discussing current diagnostic test results, clinical impression and plan of care.    LOS: 7 days   Gemma Ruan  [facility] Pager [phone]. If unable to reach me by pager, please call my cell phone at [phone].  *Please refer to [email or web address], password [email or web address] to get updated schedule on who will round on this patient, as hospitalists switch teams weekly. If 7PM-7AM, please contact night-coverage at [email or web address], password [email or web address] for any overnight needs.  08/22/2014, 10:01 AM

## 2014-08-23 ENCOUNTER — Inpatient Hospital Stay (HOSPITAL_COMMUNITY): Payer: Medicare PPO

## 2014-08-23 DIAGNOSIS — R0602 Shortness of breath: Secondary | ICD-10-CM

## 2014-08-23 DIAGNOSIS — J690 Pneumonitis due to inhalation of food and vomit: Secondary | ICD-10-CM | POA: Diagnosis not present

## 2014-08-23 LAB — CBC WITH DIFFERENTIAL/PLATELET
BASOS ABS: 0 10*3/uL (ref 0.0–0.1)
Basophils Relative: 0 % (ref 0–1)
EOS PCT: 1 % (ref 0–5)
Eosinophils Absolute: 0.1 10*3/uL (ref 0.0–0.7)
HEMATOCRIT: 32.8 % — AB (ref 36.0–46.0)
HEMOGLOBIN: 10.4 g/dL — AB (ref 12.0–15.0)
LYMPHS PCT: 8 % — AB (ref 12–46)
Lymphs Abs: 1 10*3/uL (ref 0.7–4.0)
MCH: 29.1 pg (ref 26.0–34.0)
MCHC: 31.7 g/dL (ref 30.0–36.0)
MCV: 91.9 fL (ref 78.0–100.0)
Monocytes Absolute: 0.7 10*3/uL (ref 0.1–1.0)
Monocytes Relative: 6 % (ref 3–12)
NEUTROS ABS: 10.2 10*3/uL — AB (ref 1.7–7.7)
Neutrophils Relative %: 85 % — ABNORMAL HIGH (ref 43–77)
Platelets: 314 10*3/uL (ref 150–400)
RBC: 3.57 MIL/uL — ABNORMAL LOW (ref 3.87–5.11)
RDW: 14 % (ref 11.5–15.5)
WBC: 11.9 10*3/uL — ABNORMAL HIGH (ref 4.0–10.5)

## 2014-08-23 LAB — BASIC METABOLIC PANEL
Anion gap: 4 — ABNORMAL LOW (ref 5–15)
BUN: 12 mg/dL (ref 6–23)
CHLORIDE: 106 mmol/L (ref 96–112)
CO2: 34 mmol/L — ABNORMAL HIGH (ref 19–32)
Calcium: 8.2 mg/dL — ABNORMAL LOW (ref 8.4–10.5)
Creatinine, Ser: 0.62 mg/dL (ref 0.50–1.10)
GFR calc non Af Amer: 86 mL/min — ABNORMAL LOW (ref >90)
Glucose, Bld: 127 mg/dL — ABNORMAL HIGH (ref 70–99)
Potassium: 3.6 mmol/L (ref 3.5–5.1)
Sodium: 144 mmol/L (ref 135–145)

## 2014-08-23 LAB — BRAIN NATRIURETIC PEPTIDE: B NATRIURETIC PEPTIDE 5: 620.4 pg/mL — AB (ref 0.0–100.0)

## 2014-08-23 MED ORDER — FUROSEMIDE 10 MG/ML IJ SOLN
40.0000 mg | Freq: Once | INTRAMUSCULAR | Status: AC
  Administered 2014-08-23: 40 mg via INTRAVENOUS
  Filled 2014-08-23: qty 4

## 2014-08-23 MED ORDER — LEVOFLOXACIN IN D5W 750 MG/150ML IV SOLN
750.0000 mg | INTRAVENOUS | Status: DC
  Administered 2014-08-23 – 2014-08-29 (×7): 750 mg via INTRAVENOUS
  Filled 2014-08-23 (×8): qty 150

## 2014-08-23 MED ORDER — DEXTROMETHORPHAN POLISTIREX 30 MG/5ML PO LQCR
30.0000 mg | Freq: Two times a day (BID) | ORAL | Status: DC
  Administered 2014-08-23 – 2014-08-29 (×13): 30 mg via ORAL
  Filled 2014-08-23 (×14): qty 5

## 2014-08-23 NOTE — Progress Notes (Signed)
Progress Note   Jody Summers [id number] DOB: Dec 30, 1940 DOA: 08/14/2014 PCP: Gwen PoundsUSSO,JOHN M, MD   Brief Narrative:   Jody Summers is an 74 y.o. female was admitted on 08/15/14 for treatment of small bowel obstruction. She was initially treated with conservative management, but underwent an exploratory lap with lysis of adhesions on 08/21/14 secondary to lack of clinical improvement. She was hypoxic postoperatively and maintained on full ventilator support with subsequent successful weaning by PCCM.  Assessment/Plan:   Principal Problem:   SBO (small bowel obstruction)  Taken to OR for exploratory lap and lysis of adhesions on 08/21/14 after failing conservative management.  Currently on bowel rest.  NG tube D/C'd by surgery.  Abdominal films done 08/22/14 show resolution of SBO.  Active Problems:   Acute hypoxic respiratory failure / Aspiration pneumonia  Successfully weaned after requiring ventilator support postoperatively.  CXR done 08/22/14 shows ? Pneumonia.  Afebrile.  WBC 11.9.  Likely aspiration pneumonia.  Start Levaquin.  Repeat CXR.  Start Delsym for cough.    Hypernatremia  Resolved with hypotonic IV fluids.    Hypokalemia  Resolved with Potassium added to IV fluids.    Normocytic anemia  Hemoglobin stable, anemic on admission. Monitor. No current indication for transfusion.    Anxiety/Depression  Prozac currently on hold. Continue when necessary IV Ativan.    Essential hypertension  Continue when necessary labetalol. Resume oral medications when diet advanced.    Thyroid disease  Continue Synthroid.    DVT prophylaxis/Hormone replacement therapy  Heparin started.   Code Status: Full. Family Communication: Husband updated at the bedside. Disposition Plan: Home when diet advanced and respiratory status stable.   IV Access:    Peripheral IV   Procedures and diagnostic studies:   Ct Abdomen Pelvis W Contrast 08/14/2014 1. CT findings  consistent with a small-bowel obstruction likely due to pelvic adhesions. 2. Dilatation of the mid appendix which is fluid-filled. This may be due to fluid in the cecum. I do not see any significant periappendiceal inflammatory change but recommend correlation with clinical findings and close clinical followup.   Dg Abd 2 Views 08/15/2014 1. Persistent small bowel obstruction without significant interval change. 2. Well-positioned nasogastric tube.  Dg Abd 2 Views 08/16/2014 Dilated loops of small bowel in the central abdomen, compatible with partial small bowel obstruction. Contrast in the descending and sigmoid colon.   Dg Abd 2 Views 08/19/2014 Stable mildly dilated small bowel loops are noted concerning for possible distal small bowel obstruction.   Dg Abd 2 Views 08/20/2014 : Mild improvement in partial small bowel obstruction.   Dg Chest 2 View 08/21/2014: Asymmetric airspace disease favoring pneumonia. Small bilateral pleural effusions.    Dg Abd 2 Views 08/21/2014: Resolving obstruction pattern.  No free air.     Medical Consultants:    Dr. Chevis PrettyPaul Toth, Surgery  Dr. Max Fickleouglas McQuaid, PCCM  Anti-Infectives:   None.  Subjective:   Jody Summers reports a dry cough and is more dyspneic today.  NG tube now out.  Abdominal pain exacerbated by coughing spells.  Objective:    Filed Vitals:   08/23/14 0300 08/23/14 0400 08/23/14 0500 08/23/14 0600  BP:  178/65 164/80 170/100  Pulse: 75 73 80 81  Temp:  97.9 F (36.6 C)    TempSrc:  Oral    Resp: 22 23 23 29   Height:      Weight:  72 kg (158 lb 11.7 oz)    SpO2: 95% 98%  94% 93%    Intake/Output Summary (Last 24 hours) at 08/23/14 0707 Last data filed at 08/23/14 1610  Gross per 24 hour  Intake 2491.67 ml  Output   2590 ml  Net -98.33 ml    Exam: Gen:  NAD, breathing more labored Cardiovascular:  RRR, No M/R/G Respiratory:  Lungs with expiratory wheezes today Gastrointestinal:  Abdomen tender, quiet    Extremities:  No C/E/C, SCDs in place   Data Reviewed:    Labs: Basic Metabolic Panel:  Recent Labs Lab 08/20/14 0524 08/21/14 0508 08/22/14 0700 08/23/14 0410  NA 145 149* 145 144  K 3.5 3.3* 3.8 3.6  CL 109 114* 111 106  CO2 34*  GLUCOSE 121* 140* 131* 127*  BUN CREATININE 0.62 0.49* 0.52 0.62  CALCIUM 8.2* 8.1* 8.0* 8.2*  MG  --  1.9  --   --    GFR Estimated Creatinine Clearance: 59.1 mL/min (by C-G formula based on Cr of 0.62). Liver Function Tests:  Recent Labs Lab 08/22/14 0700  AST 28  ALT 53*  ALKPHOS 50  BILITOT 0.5  PROT 6.1  ALBUMIN 2.8*   Coagulation profile  Recent Labs Lab 08/17/14 0435 08/20/14 0524  INR 1.16 1.36    CBC:  Recent Labs Lab 08/17/14 0650 08/20/14 0524 08/21/14 0508 08/22/14 0700 08/23/14 0410  WBC 7.2 7.0 7.3 13.8* 11.9*  NEUTROABS  --   --   --   --  10.2*  HGB 10.7* 9.8* 9.9* 10.9* 10.4*  HCT 33.0* 30.3* 31.3* 33.7* 32.8*  MCV 90.9 91.0 91.8 91.1 91.9  PLT 211 256 265 306 314   Microbiology Recent Results (from the past 240 hour(s))  Surgical pcr screen     Status: None   Collection Time: 08/21/14  9:22 AM  Result Value Ref Range Status   MRSA, PCR NEGATIVE NEGATIVE Final   Staphylococcus aureus NEGATIVE NEGATIVE Final    Comment:        The Xpert SA Assay (FDA approved for NASAL specimens in patients over 59 years of age), is one component of a comprehensive surveillance program.  Test performance has been validated by [facility] for patients greater than or equal to 66 year old. It is not intended to diagnose infection nor to guide or monitor treatment.      Medications:   . albuterol  5 mg Nebulization Once  . estradiol  0.05 mg Transdermal Weekly  . heparin subcutaneous  5,000 Units Subcutaneous 3 times per day  . ipratropium-albuterol  3 mL Nebulization TID  . levothyroxine  25 mcg Oral Once per day on Sun Mon Wed Thu Fri   Continuous Infusions: . dextrose 5 %  and 0.45 % NaCl with KCl 40 mEq/L 100 mL/hr at 08/23/14 9604    Time spent: 35 minutes with > 50% of time discussing current diagnostic test results, clinical impression and plan of care.    LOS: 8 days   RAMA,CHRISTINA  [facility] Pager [phone]. If unable to reach me by pager, please call my cell phone at [phone].  *Please refer to [email or web address], password [email or web address] to get updated schedule on who will round on this patient, as hospitalists switch teams weekly. If 7PM-7AM, please contact night-coverage at [email or web address], password [email or web address] for any overnight needs.  08/23/2014, 7:07 AM

## 2014-08-23 NOTE — Progress Notes (Signed)
General Surgery Note  LOS: 8 days  POD -  2 Days Post-Op  PCP - Dr. Timothy Lasso  Assessment/Plan: 1.  EXPLORATORY LAPAROTOMY, LYSIS OF ADHESION - 08/21/2014  In ICU - okay to move out from my standpoint  Will remove NGT, but keep NPO until evidence of bowel function  2.  Extubated - IS about 400 cc  She needs to work on this, but with the NGT out, I think that she will do better. 3.  HTN 4.  Depression/anxiety  She had a panic attack last PM 5.  DVT prophylaxis - on no chemoprophylaxis, will start SQ Heparin 6.  Foley in place - to remove   Principal Problem:   SBO (small bowel obstruction) Active Problems:   Anxiety   Depression   Essential hypertension   Thyroid disease   Hormone replacement therapy   Acute respiratory failure with hypoxia   Hypernatremia   Hypokalemia  Subjective:  Alert.  Doing okay.  Somewhat anxious.  Husband, [name], is a patient of mine, at bedside. Objective:   Filed Vitals:   08/23/14 0600  BP: 170/100  Pulse: 81  Temp:   Resp: 29     Intake/Output from previous day:  02/13 0701 - 02/14 0700 In: 2591.7 [P.O.:120; I.V.:2471.7] Out: 2590 [Urine:2090; Emesis/NG output:500]  Intake/Output this shift:      Physical Exam:   General: WN older WF who is alert and oriented.    HEENT: Normal. Pupils equal.  Has NGT in place .   Lungs: Clear.  IS about 400cc   Abdomen: Quiet   Wound: Clean   Lab Results:     Recent Labs  08/22/14 0700 08/23/14 0410  WBC 13.8* 11.9*  HGB 10.9* 10.4*  HCT 33.7* 32.8*  PLT 306 314    BMET    Recent Labs  08/22/14 0700 08/23/14 0410  NA 145 144  K 3.8 3.6  CL 111 106  CO2 29 34*  GLUCOSE 131* 127*  BUN 16 12  CREATININE 0.52 0.62  CALCIUM 8.0* 8.2*    PT/INR  No results for input(s): LABPROT, INR in the last 72 hours.  ABG    Recent Labs  08/21/14 1408  PHART 7.416  HCO3 25.4*     Studies/Results:  Dg Chest 2 View  08/21/2014   CLINICAL DATA:  Nausea.  Weakness.  Shortness of  breath  EXAM: CHEST  2 VIEW  COMPARISON:  None.  FINDINGS: Asymmetric, patchy airspace disease bilaterally. There are small pleural effusions. No cardiomegaly. Negative aortic however contours.  Postoperative changes to the right neck and gallbladder fossa.  IMPRESSION: Asymmetric airspace disease favoring pneumonia. Small bilateral pleural effusions.   Electronically Signed   By: Marnee Spring M.D.   On: 08/21/2014 08:51   Dg Abd 2 Views  08/21/2014   CLINICAL DATA:  Nausea, mid abdominal pain, abdominal distention  EXAM: ABDOMEN - 2 VIEW  COMPARISON:  08/20/2014  FINDINGS: Continued mild decreased in the degree of the small bowel gaseous distention in the left abdomen with air-fluid levels. Air present in the colon. Negative for free air. Prior cholecystectomy noted. Heart is enlarged with bibasilar opacities, suspect atelectasis.  IMPRESSION: Resolving obstruction pattern.  No free air.   Electronically Signed   By: Judie Petit.  Shick M.D.   On: 08/21/2014 08:48     Anti-infectives:   Anti-infectives    Start     Dose/Rate Route Frequency Ordered Stop   08/21/14 1000  ceFAZolin (ANCEF) IVPB 2 g/50 mL  premix     2 g 100 mL/hr over 30 Minutes Intravenous  Once 08/21/14 1039 08/21/14 1027      Jody Kinavid Dandre Sisler, MD, FACS Pager: [phone] [facility] Office: [phone] 08/23/2014

## 2014-08-24 LAB — TYPE AND SCREEN
ABO/RH(D): A POS
Antibody Screen: NEGATIVE
UNIT DIVISION: 0
Unit division: 0

## 2014-08-24 MED ORDER — IPRATROPIUM-ALBUTEROL 0.5-2.5 (3) MG/3ML IN SOLN
3.0000 mL | Freq: Two times a day (BID) | RESPIRATORY_TRACT | Status: DC
  Administered 2014-08-25 – 2014-08-29 (×8): 3 mL via RESPIRATORY_TRACT
  Filled 2014-08-24 (×9): qty 3

## 2014-08-24 MED ORDER — FUROSEMIDE 10 MG/ML IJ SOLN
40.0000 mg | Freq: Once | INTRAMUSCULAR | Status: AC
  Administered 2014-08-24: 40 mg via INTRAVENOUS
  Filled 2014-08-24: qty 4

## 2014-08-24 MED ORDER — VITAMINS A & D EX OINT
TOPICAL_OINTMENT | CUTANEOUS | Status: AC
  Administered 2014-08-24: 1
  Filled 2014-08-24: qty 15

## 2014-08-24 NOTE — Progress Notes (Signed)
INITIAL NUTRITION ASSESSMENT  DOCUMENTATION CODES Per approved criteria  -Not Applicable   INTERVENTION: - Diet advancement as medically able per surgery - If diet unable to be advanced, recommend consideration of nutrition support.  - Once diet advanced, add Resource Breeze po TID, each supplement provides 250 kcal and 9 grams of protein - RD will continue to monitor  NUTRITION DIAGNOSIS: Inadequate oral intake related to inability to eat as evidenced by NPO/sips.   Goal: Pt to meet >/= 90% of their estimated nutrition needs  Monitor:  Weight trend, po intake, acceptance of supplements, labs  Reason for Assessment: Length of stay  74 y.o. female  Admitting Dx: SBO (small bowel obstruction)  ASSESSMENT: - Pt admitted with SBO- nausea, vomiting and abdominal pain. - S/P exp lap for lysis of adhesions. Extubated 2/12. - Has been mostly NPO or on clear liquid diet x 9 days.  - Pt reports usual body weight as 132 lbs. Wt elevate r/t fluid overload.  - No signs of fat or muscle wasting.  - Diet upgraded to sips of clears and popsickles. Pt tolerating. Slow diet advancement.  - Recommend nutrition support if diet unable to be advanced past clear liquids.  - Labs reviewed.   Height: Ht Readings from Last 1 Encounters:  08/15/14  (1.626 m)    Weight: Wt Readings from Last 1 Encounters:  08/24/14 151 lb 10.8 oz (68.8 kg)    Ideal Body Weight: 54.7 kg  % Ideal Body Weight: 126%  Wt Readings from Last 10 Encounters:  08/24/14 151 lb 10.8 oz (68.8 kg)    Usual Body Weight: 132 lbs  % Usual Body Weight: 114%  BMI:  Body mass index is 26.02 kg/(m^2).  Estimated Nutritional Needs: Kcal: 1500-1700 Protein: 80-90 g Fluid: 1.5 L/day  Skin: closed incision on abdomen  Diet Order: Diet NPO time specified Except for: Ice Chips, Sips with Meds, Other (See Comments)  EDUCATION NEEDS: -Education needs addressed   Intake/Output Summary (Last 24 hours) at 08/24/14  1123 Last data filed at 08/24/14 1013  Gross per 24 hour  Intake 1819.5 ml  Output   3550 ml  Net -1730.5 ml    Last BM: 2/15- loose  Labs:   Recent Labs Lab 08/21/14 0508 08/22/14 0700 08/23/14 0410  NA 149* 145 144  K 3.3* 3.8 3.6  CL 114* 111 106  CO2 29 29 34*  BUN CREATININE 0.49* 0.52 0.62  CALCIUM 8.1* 8.0* 8.2*  MG 1.9  --   --   GLUCOSE 140* 131* 127*    CBG (last 3)  No results for input(s): GLUCAP in the last 72 hours.  Scheduled Meds: . albuterol  5 mg Nebulization Once  . dextromethorphan  30 mg Oral BID  . estradiol  0.05 mg Transdermal Weekly  . heparin subcutaneous  5,000 Units Subcutaneous 3 times per day  . ipratropium-albuterol  3 mL Nebulization TID  . levofloxacin (LEVAQUIN) IV  750 mg Intravenous Q24H  . levothyroxine  25 mcg Oral Once per day on Sun Mon Wed Thu Fri    Continuous Infusions: . dextrose 5 % and 0.45 % NaCl with KCl 40 mEq/L 30 mL/hr at 08/24/14 1610    Past Medical History  Diagnosis Date  . Thyroid disease   . SBO (small bowel obstruction) 08/15/2014    Past Surgical History  Procedure Laterality Date  . Cholecystectomy    . Abdominal hysterectomy    . Thyroid surgery  Emmaline KluverHaley Zebadiah Willert MS, RD, LDN [phone]

## 2014-08-24 NOTE — Progress Notes (Signed)
CARE MANAGEMENT NOTE 08/24/2014  Patient:  Jody Summers,Jody M   Account Number:  [id number]  Date Initiated:  08/18/2014  Documentation initiated by:  Lorenda IshiharaPEELE,SUZANNE  Subjective/Objective Assessment:   74 yo female admitted with SBO. PTA lived at home with spouse.     Action/Plan:   Home when stable   Anticipated DC Date:  08/27/2014   Anticipated DC Plan:  HOME/SELF CARE      DC Planning Services  CM consult      Choice offered to / List presented to:             Status of service:  Completed, signed off Medicare Important Message given?  YES (If response is "NO", the following Medicare IM given date fields will be blank) Date Medicare IM given:  08/18/2014 Medicare IM [facility]  [facility] Date Additional Medicare IM given:   Additional Medicare IM given by:    Discharge Disposition:  HOME/SELF CARE  Per UR Regulation:  Reviewed for med. necessity/level of care/duration of stay  If discussed at Long Length of Stay Meetings, dates discussed:    Comments:  Feb. 15 2016/Rhonda L. Earlene Plateravis, RN, BSN, CCM/Case Management [employer] [phone] No discharge needs present of time of review.   [id number]/Small bowel obstruction/exp.lap with lysis of adhensions/remains on vent post op/

## 2014-08-24 NOTE — Progress Notes (Signed)
3 Days Post-Op  Subjective: Still looks rather puny.  Some flatus not much.  She has been up to chair but not able to walk yet.  She is still wheezing some with some basilar rales.    Objective: Vital signs in last 24 hours: Temp:  [97.5 F (36.4 C)-99.4 F (37.4 C)] 97.5 F (36.4 C) (02/15 0400) Pulse Rate:  [81-96] 81 (02/15 0600) Resp:  [20-27] 23 (02/15 0600) BP: (129-160)/(58-78) 129/58 mmHg (02/15 0600) SpO2:  [92 %-97 %] 97 % (02/15 0600) Weight:  [68.8 kg (151 lb 10.8 oz)] 68.8 kg (151 lb 10.8 oz) (02/15 0400) Last BM Date: 08/20/14 60 PO NPO Afebrile, VSS No labs this AM  Intake/Output from previous day: 02/14 0701 - 02/15 0700 In: 1785 [P.O.:60; I.V.:1575; IV Piggyback:150] Out: 3000 [Urine:3000] Intake/Output this shift:    General appearance: alert, cooperative, no distress and feels bad and still very weak.   Resp: some wheezing and rales both bases. GI: Incision looks fine, few BS, no distension.  Lab Results:   Recent Labs  08/22/14 0700 08/23/14 0410  WBC 13.8* 11.9*  HGB 10.9* 10.4*  HCT 33.7* 32.8*  PLT 306 314    BMET  Recent Labs  08/22/14 0700 08/23/14 0410  NA 145 144  K 3.8 3.6  CL 111 106  CO2 29 34*  GLUCOSE 131* 127*  BUN 16 12  CREATININE 0.52 0.62  CALCIUM 8.0* 8.2*   PT/INR No results for input(s): LABPROT, INR in the last 72 hours.   Recent Labs Lab 08/22/14 0700  AST 28  ALT 53*  ALKPHOS 50  BILITOT 0.5  PROT 6.1  ALBUMIN 2.8*     Lipase     Component Value Date/Time   LIPASE 18 08/15/2014 0534     Studies/Results: Dg Chest Port 1 View  08/23/2014   CLINICAL DATA:  Aspiration pneumonia.  EXAM: PORTABLE CHEST - 1 VIEW  COMPARISON:  08/21/2014  FINDINGS: There is patchy airspace filling involving the lungs bilaterally, right greater than left. Bilateral pleural effusions are present.  IMPRESSION: Increased airspace filling bilaterally.   Electronically Signed   By: Norva PavlovElizabeth  Brown M.D.   On: 08/23/2014  09:34    Medications: . albuterol  5 mg Nebulization Once  . dextromethorphan  30 mg Oral BID  . estradiol  0.05 mg Transdermal Weekly  . heparin subcutaneous  5,000 Units Subcutaneous 3 times per day  . ipratropium-albuterol  3 mL Nebulization TID  . levofloxacin (LEVAQUIN) IV  750 mg Intravenous Q24H  . levothyroxine  25 mcg Oral Once per day on Sun Mon Wed Thu Fri    Assessment/Plan SBO - EXPLORATORY LAPAROTOMY, LYSIS OF ADHESION - 08/21/2014 - Carolynne Edouardoth Post op respiratory failure/Aspiration pneumonia Hypertension Hypothyroid Anxiety Depression HRT SCD/Heparin for DVT prophylaxis  Plan:  I would like to get her up and walking some.  She is doing well with the NG out and ice chips.  I will up her to sips and popsicles and see how she does.  I think her lungs are still an issue.  She looks ok lying in bed, I don't know how well she will do up walking.    I encouraged her to do IS 10-15 times every hour.  I will ask PT to put her back on their list of folks to work with.    Defer transfer to Dr. Darnelle Catalanama.  Recheck labs in AM.   LOS: 9 days    JENNINGS,WILLARD 08/24/2014  Agree with above.  She does not look as good as yesterday.  I agree that I would go slow feeding her.  And she needs to ambulate more. Her husband, who is always here, is not at the bedside right now.  Ovidio Kin, MD, [facility] Pager: [phone] Office phone:  [phone]

## 2014-08-24 NOTE — Progress Notes (Signed)
Progress Note   Jody Summers [id number] DOB: 08-Mar-1941 DOA: 08/14/2014 PCP: Gwen PoundsUSSO,JOHN M, MD   Brief Narrative:   Jody Summers is an 74 y.o. female was admitted on 08/15/14 for treatment of small bowel obstruction. She was initially treated with conservative management, but underwent an exploratory lap with lysis of adhesions on 08/21/14 secondary to lack of clinical improvement. She was hypoxic postoperatively and maintained on full ventilator support with subsequent successful weaning by PCCM.  Assessment/Plan:   Principal Problem:   SBO (small bowel obstruction)  Taken to OR for exploratory lap and lysis of adhesions on 08/21/14 after failing conservative management.  Currently on bowel rest.  NG tube D/C'd by surgery 08/23/14.  Abdominal films done 08/22/14 show resolution of SBO.  Active Problems:   Acute hypoxic respiratory failure / Aspiration pneumonia  Successfully weaned after requiring ventilator support postoperatively.  CXR done 08/22/14 shows ? Pneumonia.  Afebrile.  WBC 11.9.  Likely aspiration pneumonia.  Levaquin started 08/23/14.  Repeat CXR done, ? Pulmonary edema.  Given Lasix x 1.  Continue Delsym for cough.  Repeat Lasix and re-check CXR in a.m.  Decrease IVF.    Hypernatremia  Resolved with hypotonic IV fluids.    Hypokalemia  Resolved with Potassium added to IV fluids.    Normocytic anemia  Hemoglobin stable, anemic on admission. Monitor. No current indication for transfusion.    Anxiety/Depression  Prozac currently on hold. Continue when necessary IV Ativan.    Essential hypertension  Continue when necessary labetalol. Resume oral medications when diet advanced.    Thyroid disease  Continue Synthroid.    DVT prophylaxis/Hormone replacement therapy  Heparin started.   Code Status: Full. Family Communication: Husband updated at the bedside. Disposition Plan: Home when diet advanced and respiratory status stable.   IV  Access:    Peripheral IV   Procedures and diagnostic studies:   Ct Abdomen Pelvis W Contrast 08/14/2014 1. CT findings consistent with a small-bowel obstruction likely due to pelvic adhesions. 2. Dilatation of the mid appendix which is fluid-filled. This may be due to fluid in the cecum. I do not see any significant periappendiceal inflammatory change but recommend correlation with clinical findings and close clinical followup.   Dg Abd 2 Views 08/15/2014 1. Persistent small bowel obstruction without significant interval change. 2. Well-positioned nasogastric tube.  Dg Abd 2 Views 08/16/2014 Dilated loops of small bowel in the central abdomen, compatible with partial small bowel obstruction. Contrast in the descending and sigmoid colon.   Dg Abd 2 Views 08/19/2014 Stable mildly dilated small bowel loops are noted concerning for possible distal small bowel obstruction.   Dg Abd 2 Views 08/20/2014 : Mild improvement in partial small bowel obstruction.   Dg Chest 2 View 08/21/2014: Asymmetric airspace disease favoring pneumonia. Small bilateral pleural effusions.    Dg Abd 2 Views 08/21/2014: Resolving obstruction pattern.  No free air.    Dg Chest Port 1 View 08/23/2014: Increased airspace filling bilaterally.     Medical Consultants:    Dr. Chevis PrettyPaul Toth, Surgery  Dr. Max Fickleouglas McQuaid, PCCM  Anti-Infectives:   None.  Subjective:   Jody Summers is still dyspneic with occasional coughing spells and post-operative abdominal pain.  Objective:    Filed Vitals:   08/24/14 0000 08/24/14 0200 08/24/14 0400 08/24/14 0600  BP: 143/78 145/65 155/66 129/58  Pulse: 90 88 90 81  Temp: 98 F (36.7 C)  97.5 F (36.4 C)   TempSrc:   Oral  Resp: Height:      Weight:   68.8 kg (151 lb 10.8 oz)   SpO2: 93% 96% 95% 97%    Intake/Output Summary (Last 24 hours) at 08/24/14 0701 Last data filed at 08/24/14 0600  Gross per 24 hour  Intake   1710 ml  Output   3000 ml    Net  -1290 ml    Exam: Gen:  NAD, breathing still labored Cardiovascular:  RRR, No M/R/G Respiratory:  Lungs decreased with bilateral rales. Gastrointestinal:  Abdomen tender, quiet  Extremities:  No C/E/C, SCDs in place   Data Reviewed:    Labs: Basic Metabolic Panel:  Recent Labs Lab 08/20/14 0524 08/21/14 0508 08/22/14 0700 08/23/14 0410  NA 145 149* 145 144  K 3.5 3.3* 3.8 3.6  CL 109 114* 111 106  CO2 34*  GLUCOSE 121* 140* 131* 127*  BUN CREATININE 0.62 0.49* 0.52 0.62  CALCIUM 8.2* 8.1* 8.0* 8.2*  MG  --  1.9  --   --    GFR Estimated Creatinine Clearance: 57.8 mL/min (by C-G formula based on Cr of 0.62). Liver Function Tests:  Recent Labs Lab 08/22/14 0700  AST 28  ALT 53*  ALKPHOS 50  BILITOT 0.5  PROT 6.1  ALBUMIN 2.8*   Coagulation profile  Recent Labs Lab 08/20/14 0524  INR 1.36    CBC:  Recent Labs Lab 08/20/14 0524 08/21/14 0508 08/22/14 0700 08/23/14 0410  WBC 7.0 7.3 13.8* 11.9*  NEUTROABS  --   --   --  10.2*  HGB 9.8* 9.9* 10.9* 10.4*  HCT 30.3* 31.3* 33.7* 32.8*  MCV 91.0 91.8 91.1 91.9  PLT 256 265 306 314   Microbiology Recent Results (from the past 240 hour(s))  Surgical pcr screen     Status: None   Collection Time: 08/21/14  9:22 AM  Result Value Ref Range Status   MRSA, PCR NEGATIVE NEGATIVE Final   Staphylococcus aureus NEGATIVE NEGATIVE Final    Comment:        The Xpert SA Assay (FDA approved for NASAL specimens in patients over 50 years of age), is one component of a comprehensive surveillance program.  Test performance has been validated by [facility] for patients greater than or equal to 56 year old. It is not intended to diagnose infection nor to guide or monitor treatment.      Medications:   . albuterol  5 mg Nebulization Once  . dextromethorphan  30 mg Oral BID  . estradiol  0.05 mg Transdermal Weekly  . heparin subcutaneous  5,000 Units Subcutaneous 3 times  per day  . ipratropium-albuterol  3 mL Nebulization TID  . levofloxacin (LEVAQUIN) IV  750 mg Intravenous Q24H  . levothyroxine  25 mcg Oral Once per day on Sun Mon Wed Thu Fri   Continuous Infusions: . dextrose 5 % and 0.45 % NaCl with KCl 40 mEq/L 75 mL/hr at 08/23/14 0912    Time spent: 35 minutes with > 50% of time discussing current diagnostic test results, clinical impression and plan of care.    LOS: 9 days   RAMA,CHRISTINA  [facility] Pager [phone]. If unable to reach me by pager, please call my cell phone at [phone].  *Please refer to [email or web address], password [email or web address] to get updated schedule on who will round on this patient, as hospitalists switch teams weekly. If 7PM-7AM, please contact night-coverage at [email or web address], password [facility] for any  overnight needs.  08/24/2014, 7:01 AM

## 2014-08-24 NOTE — Evaluation (Signed)
Physical Therapy Evaluation Patient Details Name: Jody Summers Chiquito MRN: [id number] DOB: 06/25/1941 Today's Date: 08/24/2014   History of Present Illness  74 yo female admitted 08/14/14 with  3 days of N/V and abdominal pain. S/p expl. lap, lysis of adhesions for SBO on 08/21/14. chest xray shows increased airspace filling.  Clinical Impression  Patient very motivated to  [place], very unsteady gait. Patient will benefit from PT to address problems listed in note below.    Follow Up Recommendations Home health PT;SNF;Supervision/Assistance - 24 hour (depends on progress  and  return to level  to Dc home)    Equipment Recommendations  None recommended by PT    Recommendations for Other Services OT consult     Precautions / Restrictions Precautions Precautions: Fall Precaution Comments: abdominal incision Restrictions Weight Bearing Restrictions: No      Mobility  Bed Mobility Overal bed mobility: Needs Assistance Bed Mobility: Supine to Sit     Supine to sit: Mod assist;HOB elevated     General bed mobility comments: cues for abdominal protection  Transfers Overall transfer level: Needs assistance Equipment used: 1 person hand held assist Transfers: Sit to/from [employer];Stand Pivot Transfers Sit to Stand: Mod assist Stand pivot transfers: Mod assist       General transfer comment: steady assist, very unbalanced without UE support., slow to pivot to [employer]   Ambulation/Gait Ambulation/Gait assistance: Mod assist Ambulation Distance (Feet): 20 Feet Assistive device: 1 person hand held assist       General Gait Details: held onto IV pole as  balance was  unsteady. lost balance x 3 while static standing, steady assist to regain balance  Stairs            Wheelchair Mobility    Modified Rankin (Stroke Patients Only)       Balance Overall balance assessment: Needs assistance Sitting-balance support: Bilateral upper extremity supported;Feet supported Sitting  balance-Leahy Scale: Fair     Standing balance support: During functional activity;Bilateral upper extremity supported Standing balance-Leahy Scale: Poor                               Pertinent Vitals/Pain Pain Assessment: 0-10 Pain Score: 5  Pain Descriptors / Indicators: Aching;Cramping Pain Intervention(s): Limited activity within patient's tolerance;Premedicated before session;Repositioned  Sats on 4 l 100%, drop to 85 with  Activity, ? Picking up on toe sensor.  HR 105.     Home Living Family/patient expects to be discharged to:: Private residence Living Arrangements: Spouse/significant other Available Help at Discharge: Family Type of Home: House Home Access: Stairs to enter Entrance Stairs-Rails: Right Entrance Stairs-Number of Steps: 5   Home Equipment: None Additional Comments: rode stationary bike     Prior Function Level of Independence: Independent               Hand Dominance        Extremity/Trunk Assessment   Upper Extremity Assessment: Generalized weakness           Lower Extremity Assessment: Generalized weakness         Communication   Communication: No difficulties  Cognition Arousal/Alertness: Awake/alert Behavior During Therapy: WFL for tasks assessed/performed Overall Cognitive Status: Within Functional Limits for tasks assessed                      General Comments      Exercises        Assessment/Plan  PT Assessment Patient needs continued PT services  PT Diagnosis Difficulty walking;Generalized weakness;Acute pain   PT Problem List Decreased strength;Decreased activity tolerance;Decreased balance;Decreased mobility;Decreased knowledge of precautions;Decreased safety awareness;Decreased knowledge of use of DME;Pain  PT Treatment Interventions DME instruction;Gait training;Therapeutic activities;Functional mobility training;Therapeutic exercise;Patient/family education   PT Goals (Current goals  can be found in the Care Plan section) Acute Rehab PT Goals Patient Stated Goal: to get back to exercising, ride my bike PT Goal Formulation: With patient/family Time For Goal Achievement: 09/07/14 Potential to Achieve Goals: Good    Frequency Min 3X/week   Barriers to discharge        Co-evaluation               End of Session   Activity Tolerance: Patient limited by fatigue;Patient limited by pain Patient left: in chair;with call bell/phone within reach;with family/visitor present Nurse Communication: Mobility status         Time: 1610-9604 PT Time Calculation (min) (ACUTE ONLY): 28 min   Charges:   PT Evaluation $Initial PT Evaluation Tier I: 1 Procedure PT Treatments $Gait Training: 8-22 mins   PT G Codes:        Rada Hay 08/24/2014, 10:56 AM Blanchard Kelch PT [phone]

## 2014-08-25 ENCOUNTER — Encounter (HOSPITAL_COMMUNITY): Payer: Self-pay | Admitting: General Surgery

## 2014-08-25 ENCOUNTER — Inpatient Hospital Stay (HOSPITAL_COMMUNITY): Payer: Medicare PPO

## 2014-08-25 LAB — BASIC METABOLIC PANEL
Anion gap: 6 (ref 5–15)
BUN: 11 mg/dL (ref 6–23)
CALCIUM: 8.4 mg/dL (ref 8.4–10.5)
CO2: 33 mmol/L — AB (ref 19–32)
Chloride: 98 mmol/L (ref 96–112)
Creatinine, Ser: 0.61 mg/dL (ref 0.50–1.10)
GFR calc Af Amer: >90 mL/min (ref >90)
GFR calc non Af Amer: 87 mL/min — ABNORMAL LOW (ref >90)
GLUCOSE: 114 mg/dL — AB (ref 70–99)
POTASSIUM: 3.7 mmol/L (ref 3.5–5.1)
Sodium: 137 mmol/L (ref 135–145)

## 2014-08-25 LAB — CBC
HEMATOCRIT: 35.1 % — AB (ref 36.0–46.0)
Hemoglobin: 11.4 g/dL — ABNORMAL LOW (ref 12.0–15.0)
MCH: 29 pg (ref 26.0–34.0)
MCHC: 32.5 g/dL (ref 30.0–36.0)
MCV: 89.3 fL (ref 78.0–100.0)
PLATELETS: 323 10*3/uL (ref 150–400)
RBC: 3.93 MIL/uL (ref 3.87–5.11)
RDW: 13.4 % (ref 11.5–15.5)
WBC: 12.5 10*3/uL — ABNORMAL HIGH (ref 4.0–10.5)

## 2014-08-25 MED ORDER — FUROSEMIDE 10 MG/ML IJ SOLN
40.0000 mg | Freq: Once | INTRAMUSCULAR | Status: AC
  Administered 2014-08-25: 40 mg via INTRAVENOUS
  Filled 2014-08-25: qty 4

## 2014-08-25 NOTE — Progress Notes (Signed)
4 Days Post-Op  Subjective: She feels puny, having some additional flatus, didn't take anything but a cup of tea yesterday.  She says she was up voiding all the time, walked once.  She complains of her neck being sore when she turns.  No BM.    Objective: Vital signs in last 24 hours: Temp:  [98.1 F (36.7 C)-99 F (37.2 C)] 99 F (37.2 C) (02/16 0400) Pulse Rate:  [87-101] 94 (02/16 0200) Resp:  [16-27] 21 (02/16 0200) BP: (127-159)/(59-87) 149/66 mmHg (02/16 0200) SpO2:  [92 %-97 %] 97 % (02/16 0200) Weight:  [65.9 kg (145 lb 4.5 oz)] 65.9 kg (145 lb 4.5 oz) (02/16 0400) Last BM Date: 08/20/14 120 PO recorded 2500 urine output recorded Afebrile, VSS BMP Ok, WBC up some, 12.5 CXR:  Diffuse parenchymal lung opacities throughout both lungs.  Intake/Output from previous day: 02/15 0701 - 02/16 0700 In: 1009.5 [P.O.:120; I.V.:739.5; IV Piggyback:150] Out: 2500 [Urine:2500] Intake/Output this shift:    General appearance: alert, cooperative and no distress Resp: rales both sides, but not wheezing this AM.   GI: soft but slightly distended, BS are hyperactive, + flatus, no BM.  Incision is OK  Lab Results:   Recent Labs  08/23/14 0410 08/25/14 0402  WBC 11.9* 12.5*  HGB 10.4* 11.4*  HCT 32.8* 35.1*  PLT 314 323    BMET  Recent Labs  08/23/14 0410 08/25/14 0402  NA 144 137  K 3.6 3.7  CL 106 98  CO2 34* 33*  GLUCOSE 127* 114*  BUN 12 11  CREATININE 0.62 0.61  CALCIUM 8.2* 8.4   PT/INR No results for input(s): LABPROT, INR in the last 72 hours.   Recent Labs Lab 08/22/14 0700  AST 28  ALT 53*  ALKPHOS 50  BILITOT 0.5  PROT 6.1  ALBUMIN 2.8*     Lipase     Component Value Date/Time   LIPASE 18 08/15/2014 0534     Studies/Results: Dg Chest Port 1 View  08/25/2014   CLINICAL DATA:  Dyspnea.  EXAM: PORTABLE CHEST - 1 VIEW  COMPARISON:  08/23/2014  FINDINGS: Diffuse parenchymal lung opacities are present bilaterally with slightly more confluent  opacity in the perihilar regions, right greater than left. No large effusion is evident. There is unchanged right hemidiaphragm elevation. Compare to the prior study there is partial clearance of the dense right upper lobe consolidation but now more diffuse distribution of parenchymal opacities.  IMPRESSION: Diffuse parenchymal lung opacities throughout both lungs.   Electronically Signed   By: Ellery Plunk M.D.   On: 08/25/2014 06:01   Dg Chest Port 1 View  08/23/2014   CLINICAL DATA:  Aspiration pneumonia.  EXAM: PORTABLE CHEST - 1 VIEW  COMPARISON:  08/21/2014  FINDINGS: There is patchy airspace filling involving the lungs bilaterally, right greater than left. Bilateral pleural effusions are present.  IMPRESSION: Increased airspace filling bilaterally.   Electronically Signed   By: Norva Pavlov M.D.   On: 08/23/2014 09:34    Medications: . dextromethorphan  30 mg Oral BID  . estradiol  0.05 mg Transdermal Weekly  . heparin subcutaneous  5,000 Units Subcutaneous 3 times per day  . ipratropium-albuterol  3 mL Nebulization BID  . levofloxacin (LEVAQUIN) IV  750 mg Intravenous Q24H  . levothyroxine  25 mcg Oral Once per day on Sun Mon Wed Thu Fri    Assessment/Plan SBO - EXPLORATORY LAPAROTOMY, LYSIS OF ADHESION - 08/21/2014 - Carolynne Edouard Post op respiratory failure/Aspiration pneumonia Hypertension Hypothyroid  Anxiety Depression HRT SCD/Heparin for DVT prophylaxis   Plan:  I will leave her on clears from the floor as tolerated.  I think her lungs are the biggest issue.   She is somewhat tachycardic in the 90's at rest, Sats OK.  Mobilization will depend on her respiratory status.  She is now 10 days since admit with very little nutrition.     LOS: 10 days    JENNINGS,WILLARD 08/25/2014  Agree with above. Her husband is at the bedside. She looks a little better today than yesterday. She walked once in the hall.  She needs to walk as much as she can tolerate.   Ovidio Kinavid Idora Brosious, MD,  [facility] Pager: [phone] Office phone:  [phone]

## 2014-08-25 NOTE — Progress Notes (Signed)
Progress Note   Jody Summers [id number] DOB: May 27, 1941 DOA: 08/14/2014 PCP: Gwen PoundsUSSO,JOHN M, MD   Brief Narrative:   Jody Richtersngrid M Brigante is an 74 y.o. female was admitted on 08/15/14 for treatment of small bowel obstruction. She was initially treated with conservative management, but underwent an exploratory lap with lysis of adhesions on 08/21/14 secondary to lack of clinical improvement. She was hypoxic postoperatively and maintained on full ventilator support with subsequent successful weaning by PCCM. Her hospital course has been complicated by the development of aspiration pneumonia and slow progression.  Assessment/Plan:   Principal Problem:   SBO (small bowel obstruction)  Taken to OR for exploratory lap and lysis of adhesions on 08/21/14 after failing conservative management.  NG tube D/C'd by surgery 08/23/14. Started on sips of clears.  Abdominal films done 08/22/14 show resolution of SBO.  Active Problems:   Acute hypoxic respiratory failure / Aspiration pneumonia  Successfully weaned after requiring ventilator support postoperatively.  CXR done 08/22/14 shows ? Pneumonia.  Afebrile.  WBC 11.9.  Likely aspiration pneumonia.  Levaquin started 08/23/14.  Repeat CXR done, ? Pulmonary edema. We have been diuresing her daily for the past 3 days with improvement in her respiratory status.  Continue Delsym for cough.  Chest x-ray shows some clearance of the right upper lobe consolidation but more diffuse distribution of parenchymal opacities. Continue antibiotics.    Hypernatremia  Resolved with hypotonic IV fluids.    Hypokalemia  Resolved with Potassium added to IV fluids.    Normocytic anemia  Hemoglobin stable, anemic on admission. Monitor. No current indication for transfusion.    Anxiety/Depression  Prozac currently on hold. Continue when necessary IV Ativan.    Essential hypertension  Continue when necessary labetalol. Resume oral medications when diet  advanced.    Thyroid disease  Continue Synthroid.    DVT prophylaxis/Hormone replacement therapy  Heparin started.   Code Status: Full. Family Communication: Husband updated at the bedside. Disposition Plan: Home with 24-hour supervision versus SNF when diet advanced and respiratory status stable.   IV Access:    Peripheral IV   Procedures and diagnostic studies:   Ct Abdomen Pelvis W Contrast 08/14/2014 1. CT findings consistent with a small-bowel obstruction likely due to pelvic adhesions. 2. Dilatation of the mid appendix which is fluid-filled. This may be due to fluid in the cecum. I do not see any significant periappendiceal inflammatory change but recommend correlation with clinical findings and close clinical followup.   Dg Abd 2 Views 08/15/2014 1. Persistent small bowel obstruction without significant interval change. 2. Well-positioned nasogastric tube.  Dg Abd 2 Views 08/16/2014 Dilated loops of small bowel in the central abdomen, compatible with partial small bowel obstruction. Contrast in the descending and sigmoid colon.   Dg Abd 2 Views 08/19/2014 Stable mildly dilated small bowel loops are noted concerning for possible distal small bowel obstruction.   Dg Abd 2 Views 08/20/2014 : Mild improvement in partial small bowel obstruction.   Dg Chest 2 View 08/21/2014: Asymmetric airspace disease favoring pneumonia. Small bilateral pleural effusions.    Dg Abd 2 Views 08/21/2014: Resolving obstruction pattern.  No free air.    Dg Chest Port 1 View 08/23/2014: Increased airspace filling bilaterally.     Dg Chest Port 1 View 08/25/2014: Diffuse parenchymal lung opacities throughout both lungs.     Medical Consultants:    Dr. Chevis PrettyPaul Toth, Surgery  Dr. Max Fickleouglas McQuaid, PCCM  Anti-Infectives:   Levaquin 08/23/14--->  Subjective:  Jody Summers is a little less dyspneic today. She is sitting up in the chair, still having abdominal discomfort. She is  coughing less. Very little oral intake. Has had some flatus. No BMs yet.  Objective:    Filed Vitals:   08/24/14 2200 08/25/14 0000 08/25/14 0200 08/25/14 0400  BP: 130/82 134/71 149/66   Pulse: 94 93 94   Temp:  98.1 F (36.7 C)  99 F (37.2 C)  TempSrc:  Axillary  Axillary  Resp: Height:      Weight:    65.9 kg (145 lb 4.5 oz)  SpO2: 93% 93% 97%     Intake/Output Summary (Last 24 hours) at 08/25/14 0754 Last data filed at 08/25/14 0500  Gross per 24 hour  Intake 1009.5 ml  Output   2500 ml  Net -1490.5 ml    Exam: Gen:  NAD, breathing less labored Cardiovascular:  RRR, No M/R/G Respiratory:  Lungs decreased with bilateral rales. Gastrointestinal:  Abdomen tender, quiet  Extremities:  No C/E/C, SCDs in place   Data Reviewed:    Labs: Basic Metabolic Panel:  Recent Labs Lab 08/20/14 0524 08/21/14 0508 08/22/14 0700 08/23/14 0410 08/25/14 0402  NA 145 149* 145 144 137  K 3.5 3.3* 3.8 3.6 3.7  CL 109 114* 111 106 98  CO2 34* 33*  GLUCOSE 121* 140* 131* 127* 114*  BUN CREATININE 0.62 0.49* 0.52 0.62 0.61  CALCIUM 8.2* 8.1* 8.0* 8.2* 8.4  MG  --  1.9  --   --   --    GFR Estimated Creatinine Clearance: 56.8 mL/min (by C-G formula based on Cr of 0.61). Liver Function Tests:  Recent Labs Lab 08/22/14 0700  AST 28  ALT 53*  ALKPHOS 50  BILITOT 0.5  PROT 6.1  ALBUMIN 2.8*   Coagulation profile  Recent Labs Lab 08/20/14 0524  INR 1.36    CBC:  Recent Labs Lab 08/20/14 0524 08/21/14 0508 08/22/14 0700 08/23/14 0410 08/25/14 0402  WBC 7.0 7.3 13.8* 11.9* 12.5*  NEUTROABS  --   --   --  10.2*  --   HGB 9.8* 9.9* 10.9* 10.4* 11.4*  HCT 30.3* 31.3* 33.7* 32.8* 35.1*  MCV 91.0 91.8 91.1 91.9 89.3  PLT 256 265 306 314 323   Microbiology Recent Results (from the past 240 hour(s))  Surgical pcr screen     Status: None   Collection Time: 08/21/14  9:22 AM  Result Value Ref Range Status   MRSA, PCR  NEGATIVE NEGATIVE Final   Staphylococcus aureus NEGATIVE NEGATIVE Final    Comment:        The Xpert SA Assay (FDA approved for NASAL specimens in patients over 66 years of age), is one component of a comprehensive surveillance program.  Test performance has been validated by [facility] for patients greater than or equal to 31 year old. It is not intended to diagnose infection nor to guide or monitor treatment.      Medications:   . dextromethorphan  30 mg Oral BID  . estradiol  0.05 mg Transdermal Weekly  . heparin subcutaneous  5,000 Units Subcutaneous 3 times per day  . ipratropium-albuterol  3 mL Nebulization BID  . levofloxacin (LEVAQUIN) IV  750 mg Intravenous Q24H  . levothyroxine  25 mcg Oral Once per day on Sun Mon Wed Thu Fri   Continuous Infusions: . dextrose 5 % and 0.45 % NaCl  with KCl 40 mEq/L 30 mL/hr at 08/24/14 0846    Time spent: 35 minutes with > 50% of time discussing current diagnostic test results, clinical impression and plan of care with the patient and her husband.    LOS: 10 days   Pierre Dellarocco  [facility] Pager [phone]. If unable to reach me by pager, please call my cell phone at [phone].  *Please refer to [email or web address], password [email or web address] to get updated schedule on who will round on this patient, as hospitalists switch teams weekly. If 7PM-7AM, please contact night-coverage at [email or web address], password [email or web address] for any overnight needs.  08/25/2014, 7:54 AM

## 2014-08-25 NOTE — Progress Notes (Signed)
Physical Therapy Treatment Patient Details Name: Jody Summers MRN: [id number] DOB: [birth year] Today's Date: 08/25/2014    History of Present Illness 74 yo female admitted 08/14/14 with  3 days of N/V and abdominal pain. S/p expl. lap, lysis of adhesions for SBO on 08/21/14. chest xray shows increased airspace filling.    PT Comments    Patient ambulated x 150' on  4 l O2. Tolerated well. Recommend OT consult. HR 105, sats 92% ,   Follow Up Recommendations  Home health PT;SNF;Supervision/Assistance - 24 hour     Equipment Recommendations  None recommended by PT    Recommendations for Other Services OT consult     Precautions / Restrictions Precautions Precautions: Fall Precaution Comments: abdominal incision    Mobility  Bed Mobility                  Transfers Overall transfer level: Needs assistance Equipment used: Rolling walker (2 wheeled) Transfers: Sit to/from Stand Sit to Stand: Mod assist         General transfer comment: able to stand  up from recliner better today with less assist but reports L knee pain. cues for hand placemnet  Ambulation/Gait Ambulation/Gait assistance: Min assist;+2 safety/equipment Ambulation Distance (Feet): 150 Feet Assistive device: Rolling walker (2 wheeled) Gait Pattern/deviations: Step-to pattern;Step-through pattern Gait velocity: slow   General Gait Details: several stops to pass gas   Stairs            Wheelchair Mobility    Modified Rankin (Stroke Patients Only)       Balance                                    Cognition Arousal/Alertness: Awake/alert                          Exercises      General Comments        Pertinent Vitals/Pain Pain Score: 5  Pain Location: R knee pain with weight, Abdomen Pain Descriptors / Indicators: Discomfort;Sharp Pain Intervention(s): Limited activity within patient's tolerance;Monitored during session;Premedicated before session     Home Living                      Prior Function            PT Goals (current goals can now be found in the care plan section) Progress towards PT goals: Progressing toward goals    Frequency  Min 3X/week    PT Plan Current plan remains appropriate    Co-evaluation             End of Session   Activity Tolerance: Patient tolerated treatment well Patient left: in chair;with call bell/phone within reach;with family/visitor present;with nursing/sitter in room     Time: [id number] PT Time Calculation (min) (ACUTE ONLY): 26 min  Charges:  $Gait Training: 23-37 mins                    G Codes:      Jody Summers, Jody Summers 08/25/2014, 11:27 AM Jody Summers PT [phone]

## 2014-08-25 NOTE — Progress Notes (Signed)
CSW reviewed chart and noted that PT recommending Home health PT;SNF;Supervision/Assistance - 24 hour (depends on progress and return to level to Dc home).   CSW visited pt room. Pt currently on bed pan. Pt husband not present at this time.  CSW to follow up at a later time in order to complete full psychosocial assessment.   Loletta SpecterSuzanna Arlo Butt, MSW, LCSW Clinical Social Work [phone]

## 2014-08-26 DIAGNOSIS — J690 Pneumonitis due to inhalation of food and vomit: Secondary | ICD-10-CM

## 2014-08-26 DIAGNOSIS — E870 Hyperosmolality and hypernatremia: Secondary | ICD-10-CM

## 2014-08-26 LAB — BASIC METABOLIC PANEL
ANION GAP: 7 (ref 5–15)
BUN: 14 mg/dL (ref 6–23)
CHLORIDE: 96 mmol/L (ref 96–112)
CO2: 33 mmol/L — ABNORMAL HIGH (ref 19–32)
Calcium: 7.8 mg/dL — ABNORMAL LOW (ref 8.4–10.5)
Creatinine, Ser: 0.58 mg/dL (ref 0.50–1.10)
GFR calc Af Amer: >90 mL/min (ref >90)
GFR calc non Af Amer: 88 mL/min — ABNORMAL LOW (ref >90)
Glucose, Bld: 113 mg/dL — ABNORMAL HIGH (ref 70–99)
Potassium: 3.4 mmol/L — ABNORMAL LOW (ref 3.5–5.1)
Sodium: 136 mmol/L (ref 135–145)

## 2014-08-26 LAB — CBC
HCT: 34.1 % — ABNORMAL LOW (ref 36.0–46.0)
HEMOGLOBIN: 11.2 g/dL — AB (ref 12.0–15.0)
MCH: 29 pg (ref 26.0–34.0)
MCHC: 32.8 g/dL (ref 30.0–36.0)
MCV: 88.3 fL (ref 78.0–100.0)
Platelets: 341 10*3/uL (ref 150–400)
RBC: 3.86 MIL/uL — AB (ref 3.87–5.11)
RDW: 13.3 % (ref 11.5–15.5)
WBC: 10 10*3/uL (ref 4.0–10.5)

## 2014-08-26 NOTE — Progress Notes (Signed)
Physical Therapy Treatment Patient Details Name: Jody Richtersngrid M Summers MRN: [id number] DOB: 04/01/1941 Today's Date: 08/26/2014    History of Present Illness 74 yo female admitted 08/14/14 with  3 days of N/V and abdominal pain. S/p expl. lap, lysis of adhesions for SBO on 08/21/14. chest xray shows increased airspace filling.    PT Comments    Patient is progressing well, continues to require oxygen to keep sats > 90, dropping to 85  On 2 l while ambulating,  drops to 87 on RA  At rest. RECOMMEND OT CONSULT>  Follow Up Recommendations  Home health PT;Supervision/Assistance - 24 hour (spouse wants to see how she progresses  after being moved, he is home 24/7)     Equipment Recommendations  None recommended by PT    Recommendations for Other Services OT consult     Precautions / Restrictions Precautions Precautions: Fall Precaution Comments: abdominal incision, has needed O2so far. Restrictions Weight Bearing Restrictions: No    Mobility  Bed Mobility   Bed Mobility: Sidelying to Sit   Sidelying to sit: Min assist       General bed mobility comments: cues for abdominal protection by rolling to side first, trunk assist to upright  Transfers   Equipment used: Rolling walker (2 wheeled) Transfers: Sit to/from Stand Sit to Stand: Min assist         General transfer comment: cues for hand placement  Ambulation/Gait Ambulation/Gait assistance: Min assist;+2 safety/equipment Ambulation Distance (Feet): 440 Feet Assistive device: Rolling walker (2 wheeled)   Gait velocity: slow   General Gait Details: several stops to pass gas, guide RW., ambulated x 40 ' with 1 HHA   Stairs            Wheelchair Mobility    Modified Rankin (Stroke Patients Only)       Balance                                    Cognition Arousal/Alertness: Awake/alert                          Exercises      General Comments        Pertinent Vitals/Pain  Pain Score: 3  Pain Location: R kee is worse than abd. Pain Descriptors / Indicators: Discomfort;Sharp    Home Living                      Prior Function            PT Goals (current goals can now be found in the care plan section) Progress towards PT goals: Progressing toward goals    Frequency  Min 3X/week    PT Plan Current plan remains appropriate    Co-evaluation             End of Session   Activity Tolerance: Patient tolerated treatment well Patient left: in chair;with call bell/phone within reach;with family/visitor present     Time: [id number] PT Time Calculation (min) (ACUTE ONLY): 25 min  Charges:  $Gait Training: 23-37 mins                    G Codes:      Sharen HeckHill, Tyger Oka Elizabeth Tora Prunty PT [phone]  08/26/2014, 9:49 AM

## 2014-08-26 NOTE — Progress Notes (Signed)
CARE MANAGEMENT NOTE 08/26/2014  Patient:  Jody Summers,Jody Summers   Account Number:  [id number]  Date Initiated:  08/18/2014  Documentation initiated by:  Lorenda IshiharaPEELE,SUZANNE  Subjective/Objective Assessment:   74 yo female admitted with SBO. PTA lived at home with spouse.     Action/Plan:   Home when stable   Anticipated DC Date:  08/27/2014   Anticipated DC Plan:  HOME W HOME HEALTH SERVICES      DC Planning Services  CM consult      Choice offered to / List presented to:  C-1 Patient           Status of service:  Completed, signed off Medicare Important Message given?  YES (If response is "NO", the following Medicare IM given date fields will be blank) Date Medicare IM given:  08/18/2014 Medicare IM given by:  [facility] Date Additional Medicare IM given:   Additional Medicare IM given by:    Discharge Disposition:  HOME W HOME HEALTH SERVICES  Per UR Regulation:  Reviewed for med. necessity/level of care/duration of stay  If discussed at Long Length of Stay Meetings, dates discussed:    Comments:  08/26/14 Ferdinand CavaAndrea Schettino RN BSN CM [phone] [id number] Provided patient and spouse with [facility] for choice. Spouse and patient state that they [facility]. Will continue to follow up with patient discharge needs.  Feb. 15 2016/Rhonda L. Earlene Plateravis, RN, BSN, CCM/Case Management [employer] [facility] [phone] No discharge needs present of time of review.   [id number]/Small bowel obstruction/exp.lap with lysis of adhensions/remains on vent post op/

## 2014-08-26 NOTE — Progress Notes (Signed)
5 Days Post-Op  Subjective: She is up walking with PT, breathing is better, she is passing some flatus, no BM.  No nausea.  Objective: Vital signs in last 24 hours: Temp:  [98.7 F (37.1 C)-100.4 F (38 C)] 98.9 F (37.2 C) (02/17 0759) Pulse Rate:  [80-102] 89 (02/17 0815) Resp:  [17-24] 22 (02/17 0600) BP: (110-137)/(44-93) 121/93 mmHg (02/17 0815) SpO2:  [90 %-97 %] 96 % (02/17 0815) Last BM Date: 08/20/14 240 PO recorded Afebrile, Stable VS K+ 3.4 WBC 10K Intake/Output from previous day: 02/16 0701 - 02/17 0700 In: 1050 [P.O.:240; I.V.:660; IV Piggyback:150] Out: 1850 [Urine:1850] Intake/Output this shift: Total I/O In: -  Out: 500 [Urine:500]  General appearance: alert, cooperative and no distress Resp: clear to auscultation bilaterally and few rales in the base. GI: soft sore, + BS.  + flatus.  Lab Results:   Recent Labs  08/25/14 0402 08/26/14 0420  WBC 12.5* 10.0  HGB 11.4* 11.2*  HCT 35.1* 34.1*  PLT 323 341    BMET  Recent Labs  08/25/14 0402 08/26/14 0420  NA 137 136  K 3.7 3.4*  CL 98 96  CO2 33* 33*  GLUCOSE 114* 113*  BUN 11 14  CREATININE 0.61 0.58  CALCIUM 8.4 7.8*   PT/INR No results for input(s): LABPROT, INR in the last 72 hours.   Recent Labs Lab 08/22/14 0700  AST 28  ALT 53*  ALKPHOS 50  BILITOT 0.5  PROT 6.1  ALBUMIN 2.8*     Lipase     Component Value Date/Time   LIPASE 18 08/15/2014 0534     Studies/Results: Dg Chest Port 1 View  08/25/2014   CLINICAL DATA:  Dyspnea.  EXAM: PORTABLE CHEST - 1 VIEW  COMPARISON:  08/23/2014  FINDINGS: Diffuse parenchymal lung opacities are present bilaterally with slightly more confluent opacity in the perihilar regions, right greater than left. No large effusion is evident. There is unchanged right hemidiaphragm elevation. Compare to the prior study there is partial clearance of the dense right upper lobe consolidation but now more diffuse distribution of parenchymal opacities.   IMPRESSION: Diffuse parenchymal lung opacities throughout both lungs.   Electronically Signed   By: Ellery Plunkaniel R Mitchell M.D.   On: 08/25/2014 06:01    Medications: . dextromethorphan  30 mg Oral BID  . estradiol  0.05 mg Transdermal Weekly  . heparin subcutaneous  5,000 Units Subcutaneous 3 times per day  . ipratropium-albuterol  3 mL Nebulization BID  . levofloxacin (LEVAQUIN) IV  750 mg Intravenous Q24H  . levothyroxine  25 mcg Oral Once per day on Sun Mon Wed Thu Fri    Assessment/Plan SBO - EXPLORATORY LAPAROTOMY, LYSIS OF ADHESION - 08/21/2014 - Jody Edouardoth Post op respiratory failure/Aspiration pneumonia Hypertension Hypothyroid Anxiety Depression HRT SCD/Heparin for DVT prophylaxis   Plan:  She is going to the floor this AM.  I will put her on clears and see how she does.   LOS: 11 days    Jody Summers,Jody Summers 08/26/2014  Agree with above. Moved out of ICU today.  Jody Kinavid Melanni Benway, MD, [facility] Pager: [phone] Office phone:  [phone]

## 2014-08-26 NOTE — Progress Notes (Signed)
[facility] PROGRESS NOTE Interim History: 74 y.o. female was admitted on 08/15/14 for treatment of small bowel obstruction. She was initially treated with conservative management, but underwent an exploratory lap with lysis of adhesions on 08/21/14 secondary to lack of clinical improvement. She was hypoxic postoperatively and maintained on full ventilator support with subsequent successful weaning by PCCM. Hospital course has been complicated by aspiration PNA.   Assessment/Plan: SOB (shortness of breath) - S/p exploratory lap with lysis of adhesion on 12.12.16, Ng tibe d/c on 2.14.2016. - abd x-ray showed resolved of BSO. - tolerating clear liq diet. - Cont PT, transfer to regular floor.  Acute respiratory failure with hypoxia due to   Aspiration pneumonia: - Wean of vent post operatively. -  CXR on 2.13.2016 showed PNA, started on levaquin on 2.14.2016, has remained afebrile with Improved WBC. - repeated CXR showed Pulm edema, has been diuresed slowly.  Hypernatremia - resolved with hypotonic solution.  Hypokalemia - resolved.  Normocytic anemia: - Hbg has remained stable.  Hypothyroidism: - cont synthroid.  Essential hypertension - cont PRN medications    Code Status: full Family Communication: Husband Disposition Plan:    Consultants:  Dr. Chevis Pretty, Surgery  Dr. Max Fickle, PCCM  Procedures: Ct Abdomen Pelvis W Contrast 08/14/2014 1. CT findings consistent with a small-bowel obstruction likely due to pelvic adhesions. 2. Dilatation of the mid appendix which is fluid-filled. This may be due to fluid in the cecum. I do not see any significant periappendiceal inflammatory change but recommend correlation with clinical findings and close clinical followup.   Dg Abd 2 Views 08/15/2014 1. Persistent small bowel obstruction without significant interval change. 2. Well-positioned nasogastric tube.  Dg Abd 2 Views 08/16/2014 Dilated loops of small bowel in  the central abdomen, compatible with partial small bowel obstruction. Contrast in the descending and sigmoid colon.   Dg Abd 2 Views 08/19/2014 Stable mildly dilated small bowel loops are noted concerning for possible distal small bowel obstruction.   Dg Abd 2 Views 08/20/2014 : Mild improvement in partial small bowel obstruction.   Dg Chest 2 View 08/21/2014: Asymmetric airspace disease favoring pneumonia. Small bilateral pleural effusions.  Dg Abd 2 Views 08/21/2014: Resolving obstruction pattern. No free air.   Dg Chest Port 1 View 08/23/2014: Increased airspace filling bilaterally.   Dg Chest Port 1 View 08/25/2014: Diffuse parenchymal lung opacities throughout both lungs.   Antibiotics:  Levaquin  HPI/Subjective: Passing gass  Objective: Filed Vitals:   08/26/14 0000 08/26/14 0200 08/26/14 0400 08/26/14 0600  BP: 125/44 136/74 126/55 137/48  Pulse: 100 87 80 85  Temp: 98.9 F (37.2 C)  99.2 F (37.3 C)   TempSrc: Oral  Oral   Resp: Height:      Weight:      SpO2: 92% 95% 97% 97%    Intake/Output Summary (Last 24 hours) at 08/26/14 0736 Last data filed at 08/26/14 0700  Gross per 24 hour  Intake   1050 ml  Output   1850 ml  Net   -800 ml   Filed Weights   08/23/14 0400 08/24/14 0400 08/25/14 0400  Weight: 72 kg (158 lb 11.7 oz) 68.8 kg (151 lb 10.8 oz) 65.9 kg (145 lb 4.5 oz)    Exam:  General: Alert, awake, oriented x3, in no acute distress.  HEENT: No bruits, no goiter. -JVD Heart: Regular rate and rhythm. Lungs: Good air movement, clear Abdomen: Soft, nontender, nondistended, positive bowel sounds.  Neuro: Grossly  intact, nonfocal.   Data Reviewed: Basic Metabolic Panel:  Recent Labs Lab 08/21/14 0508 08/22/14 0700 08/23/14 0410 08/25/14 0402 08/26/14 0420  NA 149* 145 144 137 136  K 3.3* 3.8 3.6 3.7 3.4*  CL 114* 111 106 98 96  CO2 29 29 34* 33* 33*  GLUCOSE 140* 131* 127* 114* 113*  BUN 19 16 12 11 14     CREATININE 0.49* 0.52 0.62 0.61 0.58  CALCIUM 8.1* 8.0* 8.2* 8.4 7.8*  MG 1.9  --   --   --   --    Liver Function Tests:  Recent Labs Lab 08/22/14 0700  AST 28  ALT 53*  ALKPHOS 50  BILITOT 0.5  PROT 6.1  ALBUMIN 2.8*   No results for input(s): LIPASE, AMYLASE in the last 168 hours. No results for input(s): AMMONIA in the last 168 hours. CBC:  Recent Labs Lab 08/21/14 0508 08/22/14 0700 08/23/14 0410 08/25/14 0402 08/26/14 0420  WBC 7.3 13.8* 11.9* 12.5* 10.0  NEUTROABS  --   --  10.2*  --   --   HGB 9.9* 10.9* 10.4* 11.4* 11.2*  HCT 31.3* 33.7* 32.8* 35.1* 34.1*  MCV 91.8 91.1 91.9 89.3 88.3  PLT 265 306 314 323 341   Cardiac Enzymes: No results for input(s): CKTOTAL, CKMB, CKMBINDEX, TROPONINI in the last 168 hours. BNP (last 3 results)  Recent Labs  08/23/14 0410  BNP 620.4*    ProBNP (last 3 results) No results for input(s): PROBNP in the last 8760 hours.  CBG: No results for input(s): GLUCAP in the last 168 hours.  Recent Results (from the past 240 hour(s))  Surgical pcr screen     Status: None   Collection Time: 08/21/14  9:22 AM  Result Value Ref Range Status   MRSA, PCR NEGATIVE NEGATIVE Final   Staphylococcus aureus NEGATIVE NEGATIVE Final    Comment:        The Xpert SA Assay (FDA approved for NASAL specimens in patients over 76 years of age), is one component of a comprehensive surveillance program.  Test performance has been validated by [facility] for patients greater than or equal to 76 year old. It is not intended to diagnose infection nor to guide or monitor treatment.      Studies: Dg Chest Port 1 View  08/25/2014   CLINICAL DATA:  Dyspnea.  EXAM: PORTABLE CHEST - 1 VIEW  COMPARISON:  08/23/2014  FINDINGS: Diffuse parenchymal lung opacities are present bilaterally with slightly more confluent opacity in the perihilar regions, right greater than left. No large effusion is evident. There is unchanged right hemidiaphragm  elevation. Compare to the prior study there is partial clearance of the dense right upper lobe consolidation but now more diffuse distribution of parenchymal opacities.  IMPRESSION: Diffuse parenchymal lung opacities throughout both lungs.   Electronically Signed   By: Ellery Plunkaniel R Mitchell M.D.   On: 08/25/2014 06:01    Scheduled Meds: . dextromethorphan  30 mg Oral BID  . estradiol  0.05 mg Transdermal Weekly  . heparin subcutaneous  5,000 Units Subcutaneous 3 times per day  . ipratropium-albuterol  3 mL Nebulization BID  . levofloxacin (LEVAQUIN) IV  750 mg Intravenous Q24H  . levothyroxine  25 mcg Oral Once per day on Sun Mon Wed Thu Fri   Continuous Infusions: . dextrose 5 % and 0.45 % NaCl with KCl 40 mEq/L 30 mL/hr at 08/24/14 0846     Marinda ElkFELIZ ORTIZ, Genora Arp  [facility] Pager [phone]. If 8PM-8AM, please  contact night-coverage at [email or web address], password [facility] 08/26/2014, 7:36 AM  LOS: 11 days

## 2014-08-27 MED ORDER — POTASSIUM CHLORIDE CRYS ER 20 MEQ PO TBCR: 40.0000 meq | EXTENDED_RELEASE_TABLET | Freq: Two times a day (BID) | ORAL | Status: DC

## 2014-08-27 MED ORDER — BOOST / RESOURCE BREEZE PO LIQD
1.0000 | Freq: Three times a day (TID) | ORAL | Status: DC
  Administered 2014-08-27 – 2014-08-29 (×6): 1 via ORAL

## 2014-08-27 MED ORDER — POTASSIUM CHLORIDE CRYS ER 20 MEQ PO TBCR
40.0000 meq | EXTENDED_RELEASE_TABLET | Freq: Two times a day (BID) | ORAL | Status: AC
Start: 2014-08-27 — End: 2014-08-28
  Administered 2014-08-27: 40 meq via ORAL
  Filled 2014-08-27 (×2): qty 2

## 2014-08-27 NOTE — Evaluation (Signed)
Occupational Therapy Evaluation Patient Details Name: Jody Summers MRN: [id number] DOB: 12-14-1940 Today's Date: 08/27/2014    History of Present Illness 74 yo female admitted 08/14/14 with  3 days of N/V and abdominal pain. S/p expl. lap, lysis of adhesions for SBO on 08/21/14. chest xray shows increased airspace filling.   Clinical Impression   Pt up to the bathroom to transfer on and off toilet. Able to void and have BM. O2 sats on RA after sitting on commode 92-94% and after returning to the recliner O2 sats 95%. Pt does appear fatigued after activity however. Will follow. Spouse planning to take pt home and assist.     Follow Up Recommendations  Home health OT;Supervision/Assistance - 24 hour    Equipment Recommendations  3 in 1 bedside comode    Recommendations for Other Services       Precautions / Restrictions Precautions Precautions: Fall Precaution Comments: abdominal incision; monitor O2 Restrictions Weight Bearing Restrictions: No      Mobility Bed Mobility                  Transfers Overall transfer level: Needs assistance Equipment used: Rolling walker (2 wheeled) Transfers: Sit to/from Stand Sit to Stand: Min assist         General transfer comment: cues for hand placement    Balance                                            ADL Overall ADL's : Needs assistance/impaired Eating/Feeding: Independent;Sitting   Grooming: Wash/dry hands;Set up;Sitting   Upper Body Bathing: Set up;Sitting;Supervision/ safety   Lower Body Bathing: Moderate assistance;Sit to/from stand Lower Body Bathing Details (indicate cue type and reason): min assist for sit to stand aspect Upper Body Dressing : Set up;Supervision/safety;Sitting   Lower Body Dressing: Moderate assistance;Sit to/from stand   Toilet Transfer: Minimal assistance;Ambulation;Comfort height toilet;Grab bars;RW   Toileting- Clothing Manipulation and Hygiene: Moderate  assistance;Sit to/from stand Toileting - Clothing Manipulation Details (indicate cue type and reason): assist for periarea cleanup after BM; min assist balance.       General ADL Comments: Spouse present for session. Pt appears fatigued after toilet transfer on RA. O2 sats on RA after transferring to the toilet was 92% and quickly up to 94%. After returning to the chair on RA O2 was 95%. Educated briefly on AE options versus spouse assist as pt with discomfort trying to lean forward. Will benefit from further AE education. Discussed 3in1 option to use as BSC if needed (pain level or fatigue is up) versus over the toilet.      Vision     Perception     Praxis      Pertinent Vitals/Pain Pain Assessment: 0-10 Pain Score: 3  Pain Location: abdominal Pain Descriptors / Indicators: Discomfort Pain Intervention(s): Repositioned     Hand Dominance     Extremity/Trunk Assessment Upper Extremity Assessment Upper Extremity Assessment: Generalized weakness           Communication Communication Communication: No difficulties   Cognition Arousal/Alertness: Awake/alert Behavior During Therapy: WFL for tasks assessed/performed Overall Cognitive Status: Within Functional Limits for tasks assessed                     General Comments       Exercises       Shoulder Instructions  Home Living Family/patient expects to be discharged to:: Private residence Living Arrangements: Spouse/significant other Available Help at Discharge: Family Type of Home: House Home Access: Stairs to enter           [employer] Shower/Tub: Producer, television/film/video: Standard     Home Equipment: None          Prior Functioning/Environment Level of Independence: Independent             OT Diagnosis: Generalized weakness   OT Problem List: Decreased strength;Decreased knowledge of use of DME or AE;Decreased activity tolerance   OT Treatment/Interventions:  Self-care/ADL training;Patient/family education;Therapeutic activities;DME and/or AE instruction;Energy conservation    OT Goals(Current goals can be found in the care plan section) Acute Rehab OT Goals Patient Stated Goal: increase independence. OT Goal Formulation: With patient/family Time For Goal Achievement: 09/10/14 Potential to Achieve Goals: Good  OT Frequency: Min 2X/week   Barriers to D/C:            Co-evaluation              End of Session Equipment Utilized During Treatment: Rolling walker  Activity Tolerance: Patient limited by fatigue Patient left: in chair;with call bell/phone within reach;with chair alarm set;with family/visitor present   Time: 1128-1200 OT Time Calculation (min): 32 min Charges:  OT General Charges $OT Visit: 1 Procedure OT Evaluation $Initial OT Evaluation Tier I: 1 Procedure OT Treatments $Therapeutic Activity: 8-22 mins G-Codes:    Lennox Laity  [phone] 08/27/2014, 12:24 PM

## 2014-08-27 NOTE — Progress Notes (Signed)
PT Cancellation Note  Patient Details Name: Jody Summers MRN: [id number] DOB: 12-Nov-1940   Cancelled Treatment:    Reason Eval/Treat Not Completed: Fatigue/lethargy limiting ability to participate (pt reports fatigue and not feeling well, politely declined) Also brought restorator foot bike to room for pt to use later when feeling better (frequently rides stationary bike prior to admission).   Renald Haithcock,KATHrine E 08/27/2014, 4:00 PM Zenovia JarredKati Royal Beirne, PT, DPT 08/27/2014 Pager: [phone]

## 2014-08-27 NOTE — Progress Notes (Signed)
CARE MANAGEMENT NOTE 08/27/2014  Patient:  Jody Summers,Jody Summers   Account Number:  [id number]  Date Initiated:  08/18/2014  Documentation initiated by:  Lorenda IshiharaPEELE,SUZANNE  Subjective/Objective Assessment:   74 yo female admitted with SBO. PTA lived at home with spouse.     Action/Plan:   Home when stable   Anticipated DC Date:  08/27/2014   Anticipated DC Plan:  HOME W HOME HEALTH SERVICES      DC Planning Services  CM consult      Choice offered to / List presented to:  C-1 Patient        HH arranged  HH-2 PT  HH-3 OT      Status of service:  Completed, signed off Medicare Important Message given?  YES (If response is "NO", the following Medicare IM given date fields will be blank) Date Medicare IM given:  08/18/2014 Medicare IM given by:  [facility] Date Additional Medicare IM given:  08/27/2014 Additional Medicare IM given by:  Ferdinand CavaANDREA SCHETTINO  [facility]  Per UR Regulation:  Reviewed for med. necessity/level of care/duration of stay  If discussed at Long Length of Stay Meetings, dates discussed:    Comments:  08/27/14 Ferdinand CavaAndrea Schettino RN BSN CM [phone] Patient and spouse [facility], have communicated referral to [place], [facility]. Spouse stated that he ordered a rolling walker through [employer] which will be delivered today. Will continue to follow for qualifying home O2 needs.  08/26/14 Ferdinand CavaAndrea Schettino RN BSN CM [phone] [facility]. Spouse and patient state that they [facility]. Will continue to follow up with patient discharge needs.  Feb. 15 2016/Rhonda L. Earlene Plateravis, RN, BSN, CCM/Case Management [employer] [phone] No discharge needs present of time of review.   [id number]/Small bowel obstruction/exp.lap with lysis of adhensions/remains on vent post op/

## 2014-08-27 NOTE — Progress Notes (Signed)
Clinical Social Work  CSW received inappropriate referral to assist with SNF placement. Per chart review, PT recommending HH. CM has already spoken with patient and family who will be arranged with HH prior to DC. CSW is signing off but available if needed.  [place]olly Moh[place], [place] [phone]

## 2014-08-27 NOTE — Progress Notes (Signed)
[facility] PROGRESS NOTE Interim History: 74 y.o. female was admitted on 08/15/14 for treatment of small bowel obstruction. She was initially treated with conservative management, but underwent an exploratory lap with lysis of adhesions on 08/21/14 secondary to lack of clinical improvement. She was hypoxic postoperatively and maintained on full ventilator support with subsequent successful weaning by PCCM. Hospital course has been complicated by aspiration PNA.   Assessment/Plan: SOB (shortness of breath) - S/p exploratory lap with lysis of adhesion on 12.12.16, Ng tube d/c on 2.14.2016. - abd x-ray showed resolved of BSO. - Appreciate surgery's assistance, advancing diet. - Continue physical therapy  Acute respiratory failure with hypoxia due to  Aspiration pneumonia and pulmonary edema: - Wean of vent post operatively. - CXR on 2.13.2016 showed PNA and mild diffuse interstitial infiltrates, started on levaquin and Lasix on 2.14.2016 will continue Levaquin for a total 7 days. - Completed diuresis, will DC Lasix. - From medical standpoint we will should be able to discharge her home in the morning.  Hypernatremia - resolved with hypotonic solution.  Hypokalemia - Most likely due to Lasix use will replete orally. - Check a basic metabolic panel  Normocytic anemia: - Hbg has remained stable.  Hypothyroidism: - cont synthroid.  Essential hypertension - cont PRN medications    Code Status: full Family Communication: Husband Disposition Plan:    Consultants:  Dr. Chevis PrettyPaul Toth, Surgery  Dr. Max Fickleouglas McQuaid, PCCM  Procedures: Ct Abdomen Pelvis W Contrast 08/14/2014 1. CT findings consistent with a small-bowel obstruction likely due to pelvic adhesions. 2. Dilatation of the mid appendix which is fluid-filled. This may be due to fluid in the cecum. I do not see any significant periappendiceal inflammatory change but recommend correlation with clinical findings and close  clinical followup.   Dg Abd 2 Views 08/15/2014 1. Persistent small bowel obstruction without significant interval change. 2. Well-positioned nasogastric tube.  Dg Abd 2 Views 08/16/2014 Dilated loops of small bowel in the central abdomen, compatible with partial small bowel obstruction. Contrast in the descending and sigmoid colon.   Dg Abd 2 Views 08/19/2014 Stable mildly dilated small bowel loops are noted concerning for possible distal small bowel obstruction.   Dg Abd 2 Views 08/20/2014 : Mild improvement in partial small bowel obstruction.   Dg Chest 2 View 08/21/2014: Asymmetric airspace disease favoring pneumonia. Small bilateral pleural effusions.  Dg Abd 2 Views 08/21/2014: Resolving obstruction pattern. No free air.   Dg Chest Port 1 View 08/23/2014: Increased airspace filling bilaterally.   Dg Chest Port 1 View 08/25/2014: Diffuse parenchymal lung opacities throughout both lungs.   Antibiotics:  Levaquin  HPI/Subjective: Passing gass, no complains breathing is better.  Objective: Filed Vitals:   08/26/14 2054 08/27/14 0538 08/27/14 0822 08/27/14 0830  BP: 127/59 132/55    Pulse: 86 93    Temp: 98.8 F (37.1 C) 98.5 F (36.9 C)    TempSrc: Oral Oral    Resp:      Height:      Weight:      SpO2: 95% 97% 96% 96%    Intake/Output Summary (Last 24 hours) at 08/27/14 0940 Last data filed at 08/26/14 1803  Gross per 24 hour  Intake    520 ml  Output      0 ml  Net    520 ml   Filed Weights   08/23/14 0400 08/24/14 0400 08/25/14 0400  Weight: 72 kg (158 lb 11.7 oz) 68.8 kg (151 lb 10.8 oz) 65.9 kg (145  lb 4.5 oz)    Exam:  General: Alert, awake, oriented x3, in no acute distress.  HEENT: No bruits, no goiter. -JVD Heart: Regular rate and rhythm. Lungs: Good air movement, clear Abdomen: Soft, nontender, nondistended, positive bowel sounds.  Neuro: Grossly intact, nonfocal.   Data Reviewed: Basic Metabolic Panel:  Recent Labs Lab  08/21/14 0508 08/22/14 0700 08/23/14 0410 08/25/14 0402 08/26/14 0420  NA 149* 145 144 137 136  K 3.3* 3.8 3.6 3.7 3.4*  CL 114* 111 106 98 96  CO2 29 29 34* 33* 33*  GLUCOSE 140* 131* 127* 114* 113*  BUN CREATININE 0.49* 0.52 0.62 0.61 0.58  CALCIUM 8.1* 8.0* 8.2* 8.4 7.8*  MG 1.9  --   --   --   --    Liver Function Tests:  Recent Labs Lab 08/22/14 0700  AST 28  ALT 53*  ALKPHOS 50  BILITOT 0.5  PROT 6.1  ALBUMIN 2.8*   No results for input(s): LIPASE, AMYLASE in the last 168 hours. No results for input(s): AMMONIA in the last 168 hours. CBC:  Recent Labs Lab 08/21/14 0508 08/22/14 0700 08/23/14 0410 08/25/14 0402 08/26/14 0420  WBC 7.3 13.8* 11.9* 12.5* 10.0  NEUTROABS  --   --  10.2*  --   --   HGB 9.9* 10.9* 10.4* 11.4* 11.2*  HCT 31.3* 33.7* 32.8* 35.1* 34.1*  MCV 91.8 91.1 91.9 89.3 88.3  PLT 265 306 314 323 341   Cardiac Enzymes: No results for input(s): CKTOTAL, CKMB, CKMBINDEX, TROPONINI in the last 168 hours. BNP (last 3 results)  Recent Labs  08/23/14 0410  BNP 620.4*    ProBNP (last 3 results) No results for input(s): PROBNP in the last 8760 hours.  CBG: No results for input(s): GLUCAP in the last 168 hours.  Recent Results (from the past 240 hour(s))  Surgical pcr screen     Status: None   Collection Time: 08/21/14  9:22 AM  Result Value Ref Range Status   MRSA, PCR NEGATIVE NEGATIVE Final   Staphylococcus aureus NEGATIVE NEGATIVE Final    Comment:        The Xpert SA Assay (FDA approved for NASAL specimens in patients over 29 years of age), is one component of a comprehensive surveillance program.  Test performance has been validated by [facility] for patients greater than or equal to 76 year old. It is not intended to diagnose infection nor to guide or monitor treatment.      Studies: No results found.  Scheduled Meds: . dextromethorphan  30 mg Oral BID  . estradiol  0.05 mg Transdermal Weekly  .  feeding supplement (RESOURCE BREEZE)  1 Container Oral TID BM  . heparin subcutaneous  5,000 Units Subcutaneous 3 times per day  . ipratropium-albuterol  3 mL Nebulization BID  . levofloxacin (LEVAQUIN) IV  750 mg Intravenous Q24H  . levothyroxine  25 mcg Oral Once per day on Sun Mon Wed Thu Fri  . potassium chloride  40 mEq Oral BID   Continuous Infusions: . dextrose 5 % and 0.45 % NaCl with KCl 40 mEq/L 30 mL/hr at 08/26/14 2005     Marinda Elk  [facility] Pager [phone]. If 8PM-8AM, please contact night-coverage at [email or web address], password [facility] 08/27/2014, 9:40 AM  LOS: 12 days

## 2014-08-27 NOTE — Progress Notes (Signed)
6 Days Post-Op  Subjective: She looks good, says her breathing is better, but just had a treatment.  Having some loose stool this AM.  Her wound looks fine.  Objective: Vital signs in last 24 hours: Temp:  [98.5 F (36.9 C)-98.8 F (37.1 C)] 98.5 F (36.9 C) (02/18 0538) Pulse Rate:  [86-93] 93 (02/18 0538) Resp:  [18-22] 18 (02/17 1445) BP: (115-132)/(55-68) 132/55 mmHg (02/18 0538) SpO2:  [95 %-97 %] 96 % (02/18 0822) Last BM Date: 08/26/14 200 PO recorded, 3 BM's recorded, since yesterday. Clear liquids Afebrile, VSS K+ 3.4 WBC down to 10K Intake/Output from previous day: 02/17 0701 - 02/18 0700 In: 680 [P.O.:200; I.V.:330; IV Piggyback:150] Out: 500 [Urine:500] Intake/Output this shift:    General appearance: alert, cooperative and no distress Resp: clear to auscultation bilaterally GI: soft sore, + BS, wounds look fine, +BM.  She still has clear dressing intact.  Lab Results:   Recent Labs  08/25/14 0402 08/26/14 0420  WBC 12.5* 10.0  HGB 11.4* 11.2*  HCT 35.1* 34.1*  PLT 323 341    BMET  Recent Labs  08/25/14 0402 08/26/14 0420  NA 137 136  K 3.7 3.4*  CL 98 96  CO2 33* 33*  GLUCOSE 114* 113*  BUN 11 14  CREATININE 0.61 0.58  CALCIUM 8.4 7.8*   PT/INR No results for input(s): LABPROT, INR in the last 72 hours.   Recent Labs Lab 08/22/14 0700  AST 28  ALT 53*  ALKPHOS 50  BILITOT 0.5  PROT 6.1  ALBUMIN 2.8*     Lipase     Component Value Date/Time   LIPASE 18 08/15/2014 0534     Studies/Results: No results found.  Medications: . dextromethorphan  30 mg Oral BID  . estradiol  0.05 mg Transdermal Weekly  . heparin subcutaneous  5,000 Units Subcutaneous 3 times per day  . ipratropium-albuterol  3 mL Nebulization BID  . levofloxacin (LEVAQUIN) IV  750 mg Intravenous Q24H  . levothyroxine  25 mcg Oral Once per day on Sun Mon Wed Thu Fri    Assessment/Plan SBO - EXPLORATORY LAPAROTOMY, LYSIS OF ADHESION - 08/21/2014 -  Carolynne Edouardoth Post op respiratory failure/Aspiration pneumonia Hypertension Hypothyroid Anxiety Depression HRT SCD/Heparin for DVT prophylaxis   Plan:  Full liquids for lunch, and if she does well soft for supper.  Her biggest issue now is her pulmonary status and they have reduced her to 1 liter [place].  Continue to mobilize.    LOS: 12 days    JENNINGS,WILLARD 08/27/2014  Agree with above. Husband at the bedside. She is looking a little better everyday.  Ovidio Kinavid Bryley Chrisman, MD, [facility] Pager: [phone] Office phone:  [phone]

## 2014-08-28 LAB — BASIC METABOLIC PANEL WITH GFR
Anion gap: 7 (ref 5–15)
BUN: 10 mg/dL (ref 6–23)
CO2: 28 mmol/L (ref 19–32)
Calcium: 8.3 mg/dL — ABNORMAL LOW (ref 8.4–10.5)
Chloride: 105 mmol/L (ref 96–112)
Creatinine, Ser: 0.6 mg/dL (ref 0.50–1.10)
GFR calc Af Amer: >90 mL/min
GFR calc non Af Amer: 87 mL/min — ABNORMAL LOW
Glucose, Bld: 114 mg/dL — ABNORMAL HIGH (ref 70–99)
Potassium: 4 mmol/L (ref 3.5–5.1)
Sodium: 140 mmol/L (ref 135–145)

## 2014-08-28 MED ORDER — LEVOFLOXACIN 500 MG PO TABS: 500.0000 mg | ORAL_TABLET | Freq: Every day | ORAL | Status: DC

## 2014-08-28 MED ORDER — POLYETHYLENE GLYCOL 3350 17 G PO PACK
17.0000 g | PACK | Freq: Once | ORAL | Status: AC
  Administered 2014-08-28: 17 g via ORAL
  Filled 2014-08-28: qty 1

## 2014-08-28 NOTE — Progress Notes (Signed)
7 Days Post-Op  Subjective: First soft diet this AM, no real BM after I saw her yesterday AM.  She did well with full liquids and first soft diet this AM.   Objective: Vital signs in last 24 hours: Temp:  [98.2 F (36.8 C)-98.6 F (37 C)] 98.6 F (37 C) (02/19 0738) Pulse Rate:  [85-102] 85 (02/19 0738) Resp:  [18-20] 20 (02/19 0738) BP: (111-128)/(52-64) 125/52 mmHg (02/19 0738) SpO2:  [92 %-97 %] 92 % (02/19 0738) Last BM Date: 08/27/14 (smear) I/O=? 2 BM recorded yesterday Labs OK Afebrile, VSS Intake/Output from previous day:   Intake/Output this shift: Total I/O In: 1048.5 [I.V.:1048.5] Out: -   General appearance: alert, cooperative and no distress Resp: clear anterior, no wheezing. GI: soft, non-tender; bowel sounds normal; no masses,  no organomegaly and incision looks fine.  Lab Results:   Recent Labs  08/26/14 0420  WBC 10.0  HGB 11.2*  HCT 34.1*  PLT 341    BMET  Recent Labs  08/26/14 0420 08/28/14 0551  NA 136 140  K 3.4* 4.0  CL 96 105  CO2 33* 28  GLUCOSE 113* 114*  BUN 14 10  CREATININE 0.58 0.60  CALCIUM 7.8* 8.3*   PT/INR No results for input(s): LABPROT, INR in the last 72 hours.   Recent Labs Lab 08/22/14 0700  AST 28  ALT 53*  ALKPHOS 50  BILITOT 0.5  PROT 6.1  ALBUMIN 2.8*     Lipase     Component Value Date/Time   LIPASE 18 08/15/2014 0534     Studies/Results: No results found.  Medications: . dextromethorphan  30 mg Oral BID  . estradiol  0.05 mg Transdermal Weekly  . feeding supplement (RESOURCE BREEZE)  1 Container Oral TID BM  . heparin subcutaneous  5,000 Units Subcutaneous 3 times per day  . ipratropium-albuterol  3 mL Nebulization BID  . levofloxacin (LEVAQUIN) IV  750 mg Intravenous Q24H  . levothyroxine  25 mcg Oral Once per day on Sun Mon Wed Thu Fri  . potassium chloride  40 mEq Oral BID    Assessment/Plan SBO - EXPLORATORY LAPAROTOMY, LYSIS OF ADHESION - 08/21/2014 - Carolynne Edouardoth Post op respiratory  failure/Aspiration pneumonia Hypertension Hypothyroid Anxiety Depression HRT SCD/Heparin for DVT prophylaxis   Plan:  Her husband is very concerned about her deconditioning, and inability to get her up walking more.  They only walked twice yesterday.  I think she needs at least another day and another BM to be sure everything is working well.  She needs OT and PT I don't know if she can go home with just him.   I will defer to Dr. Robb Matarrtiz.  I will set her up to get staples out next week and to see Dr. Carolynne Edouardoth in 2 weeks.  Follow up instructions are in the AVS.    LOS: 13 days    JENNINGS,WILLARD 08/28/2014  Agree with above. Her husband is at the bedside.   She feels better and plans to to home later today. Follow up with our practice as above.  Ovidio Kinavid Taiylor Virden, MD, [facility] Pager: [phone] Office phone:  [phone]

## 2014-08-28 NOTE — Discharge Summary (Signed)
Physician Discharge Summary  Jody Summers ZOX:[id number] DOB: 13-Nov-1940 DOA: 08/14/2014  PCP: Gwen Pounds, MD  Admit date: 08/14/2014 Discharge date: 08/28/2014  Time spent: 40 minutes  Recommendations for Outpatient Follow-up:  1. Follow-up with surgery as an outpatient in 2-4. 2. We'll need to remove staples. 3. We'll follow-up with PCP in 2 weeks.  Discharge Diagnoses:  Principal Problem:   SBO (small bowel obstruction) Active Problems:   SOB (shortness of breath)   Anxiety   Depression   Essential hypertension   Thyroid disease   Hormone replacement therapy   Acute respiratory failure with hypoxia   Hypernatremia   Hypokalemia   Aspiration pneumonia   Discharge Condition: Stable  Diet recommendation: Regular diet  Filed Weights   08/23/14 0400 08/24/14 0400 08/25/14 0400  Weight: 72 kg (158 lb 11.7 oz) 68.8 kg (151 lb 10.8 oz) 65.9 kg (145 lb 4.5 oz)    History of present illness:  74 y.o.WF PMHx Anxiety, Depression, HTN, thyroid disease, HRT Presents for evaluation of abdominal pain present for 3 days, intermittently, which waxes and wanes. The pain is a cramping sensation primarily in the suprapubic region. She has intermittent vomiting, when she is having bouts of pain, but otherwise has been able to tolerate 3 meals a day and tolerate water. Last episode of vomiting was about 48 hours ago. She's never had this previously. She denies chest pain, cough, weakness, dizziness. There are no other no modifying factors.   Hospital Course:  SOB (shortness of breath): - Abdominal x-ray was done that showed results as below. CT scan was done with results as below She's failed conservative management. - S/p exploratory lap with lysis of adhesion on 12.12.16. - abd x-ray showed resolved of SBO. - Surgery advance her diet and she continue to have bowel movements. - Physical therapy recommended home health PT.  Acute respiratory failure with hypoxia due to Aspiration  pneumonia and pulmonary edema: - It was hard to extubate after surgery was likely due to pulmonary edema. Wean of vent post operatively. - CXR on 2.13.2016 showed PNA and mild diffuse interstitial infiltrates, started on levaquin and Lasix on 2.14.2016. - She diureses well with IV Lasix. - she  will continue Levaquin for a total 7 days.  Hypernatremia - resolved with hypotonic solution.  Hypokalemia - Most likely due to Lasix use will replete orally. - Resolved.  Normocytic anemia: - Hbg has remained stable.  Hypothyroidism: - cont synthroid.  Essential hypertension - cont PRN medications   Procedures: Ct Abdomen Pelvis W Contrast 08/14/2014 1. CT findings consistent with a small-bowel obstruction likely due to pelvic adhesions. 2. Dilatation of the mid appendix which is fluid-filled. This may be due to fluid in the cecum. I do not see any significant periappendiceal inflammatory change but recommend correlation with clinical findings and close clinical followup.   Dg Abd 2 Views 08/15/2014 1. Persistent small bowel obstruction without significant interval change. 2. Well-positioned nasogastric tube.  Dg Abd 2 Views 08/16/2014 Dilated loops of small bowel in the central abdomen, compatible with partial small bowel obstruction. Contrast in the descending and sigmoid colon.   Dg Abd 2 Views 08/19/2014 Stable mildly dilated small bowel loops are noted concerning for possible distal small bowel obstruction.   Dg Abd 2 Views 08/20/2014 : Mild improvement in partial small bowel obstruction.   Dg Chest 2 View 08/21/2014: Asymmetric airspace disease favoring pneumonia. Small bilateral pleural effusions.  Dg Abd 2 Views 08/21/2014: Resolving obstruction pattern. No free air.  Dg Chest Port 1 View 08/23/2014: Increased airspace filling bilaterally.    Dg Chest Port 1 View 08/25/2014: Diffuse parenchymal lung opacities throughout both lungs.  (i.e. Studies not  automatically included, echos, thoracentesis, etc; not x-rays)  Consultations:  Gen. Surgery  Critical care  Discharge Exam: Filed Vitals:   08/28/14 0738  BP: 125/52  Pulse: 85  Temp: 98.6 F (37 C)  Resp: 20    General: Awake alert and oriented 3 Cardiovascular: Regular rate and rhythm Respiratory: Good air movement clear to auscultation  Discharge Instructions   Discharge Instructions    Diet - low sodium heart healthy    Complete by:  As directed      Increase activity slowly    Complete by:  As directed           Current Discharge Medication List    START taking these medications   Details  levofloxacin (LEVAQUIN) 500 MG tablet Take 1 tablet (500 mg total) by mouth daily. Qty: 4 tablet, Refills: 0      CONTINUE these medications which have NOT CHANGED   Details  aspirin 81 MG chewable tablet Chew 81 mg by mouth daily.    Cholecalciferol (VITAMIN D3) 400 UNITS CAPS Take 400 mg by mouth daily.    estradiol (VIVELLE-DOT) 0.05 MG/24HR patch Place 1 patch onto the skin once a week. Monday    FLUoxetine (PROZAC) 20 MG capsule Take 20 mg by mouth at bedtime.    labetalol (NORMODYNE) 100 MG tablet Take 50 mg by mouth 2 (two) times daily.    levothyroxine (SYNTHROID, LEVOTHROID) 25 MCG tablet Take 25 mcg by mouth daily before breakfast. Monday Wednesday thursday Friday Sunday    LORazepam (ATIVAN) 0.5 MG tablet Take 0.5 mg by mouth at bedtime.    MIRTAZAPINE PO Take 1 tablet by mouth at bedtime.     multivitamin-iron-minerals-folic acid (CENTRUM) chewable tablet Chew 1 tablet by mouth daily.    pyridoxine (B-6) 100 MG tablet Take 200 mg by mouth daily.       Allergies  Allergen Reactions  . Reglan [Metoclopramide]       The results of significant diagnostics from this hospitalization (including imaging, microbiology, ancillary and laboratory) are listed below for reference.    Significant Diagnostic Studies: Dg Chest 2 View  08/21/2014    CLINICAL DATA:  Nausea.  Weakness.  Shortness of breath  EXAM: CHEST  2 VIEW  COMPARISON:  None.  FINDINGS: Asymmetric, patchy airspace disease bilaterally. There are small pleural effusions. No cardiomegaly. Negative aortic however contours.  Postoperative changes to the right neck and gallbladder fossa.  IMPRESSION: Asymmetric airspace disease favoring pneumonia. Small bilateral pleural effusions.   Electronically Signed   By: Marnee Spring M.D.   On: 08/21/2014 08:51   Ct Abdomen Pelvis W Contrast  08/14/2014   CLINICAL DATA:  Two-day history of abdominal pain with diarrhea and vomiting.  EXAM: CT ABDOMEN AND PELVIS WITH CONTRAST  TECHNIQUE: Multidetector CT imaging of the abdomen and pelvis was performed using the standard protocol following bolus administration of intravenous contrast.  CONTRAST:  OMNIPAQUE IOHEXOL 300 MG/ML  SOLN  COMPARISON:  None.  FINDINGS: Lower chest: Dependent atelectasis and scarring changes. No pleural effusion. The heart is normal in size. No pericardial effusion. The distal esophagus is grossly normal. There is a small hiatal hernia.  Hepatobiliary: No focal hepatic lesions or intrahepatic biliary dilatation. The gallbladder is surgically absent. No common bowel duct dilatation.  Pancreas: Normal  Spleen: Normal  Adrenals/Urinary Tract: Normal  Stomach/Bowel: The stomach and duodenum are unremarkable. The small bowel is dilated with air-fluid levels. Stable areas of caliber change in the pelvis with a decompressed/ normal-appearing terminal ileum. Findings most likely due to pelvic adhesions. The midportion of the appendix is dilated and fluid-filled. The proximal and distal aspects of the appendix appears normal in caliber. There is mild mucosal enhancement. No significant inflammatory changes.  Vascular/Lymphatic: No mesenteric or retroperitoneal mass or adenopathy. Small scattered lymph nodes are noted. The aorta is normal in caliber. The branch vessels are patent. The  major venous structures are patent.  Reproductive: The uterus is surgically absent. The ovaries are not identified. The bladder is normal. No pelvic mass or adenopathy. There is a small amount of free pelvic fluid. No inguinal mass or adenopathy.  Other: No abdominal wall hernia or subcutaneous lesions  Musculoskeletal: No significant bony findings.  IMPRESSION: 1. CT findings consistent with a small-bowel obstruction likely due to pelvic adhesions. 2. Dilatation of the mid appendix which is fluid-filled. This may be due to fluid in the cecum. I do not see any significant periappendiceal inflammatory change but recommend correlation with clinical findings and close clinical followup.   Electronically Signed   By: Loralie Champagne M.D.   On: 08/14/2014 23:56   Dg Chest Port 1 View  08/25/2014   CLINICAL DATA:  Dyspnea.  EXAM: PORTABLE CHEST - 1 VIEW  COMPARISON:  08/23/2014  FINDINGS: Diffuse parenchymal lung opacities are present bilaterally with slightly more confluent opacity in the perihilar regions, right greater than left. No large effusion is evident. There is unchanged right hemidiaphragm elevation. Compare to the prior study there is partial clearance of the dense right upper lobe consolidation but now more diffuse distribution of parenchymal opacities.  IMPRESSION: Diffuse parenchymal lung opacities throughout both lungs.   Electronically Signed   By: Ellery Plunk M.D.   On: 08/25/2014 06:01   Dg Chest Port 1 View  08/23/2014   CLINICAL DATA:  Aspiration pneumonia.  EXAM: PORTABLE CHEST - 1 VIEW  COMPARISON:  08/21/2014  FINDINGS: There is patchy airspace filling involving the lungs bilaterally, right greater than left. Bilateral pleural effusions are present.  IMPRESSION: Increased airspace filling bilaterally.   Electronically Signed   By: Norva Pavlov M.D.   On: 08/23/2014 09:34   Dg Abd 2 Views  08/21/2014   CLINICAL DATA:  Nausea, mid abdominal pain, abdominal distention  EXAM: ABDOMEN  - 2 VIEW  COMPARISON:  08/20/2014  FINDINGS: Continued mild decreased in the degree of the small bowel gaseous distention in the left abdomen with air-fluid levels. Air present in the colon. Negative for free air. Prior cholecystectomy noted. Heart is enlarged with bibasilar opacities, suspect atelectasis.  IMPRESSION: Resolving obstruction pattern.  No free air.   Electronically Signed   By: Judie Petit.  Shick M.D.   On: 08/21/2014 08:48   Dg Abd 2 Views  08/20/2014   CLINICAL DATA:  Abdominal distention. Vomiting. Lower abdominal pain. Small bowel obstruction.  EXAM: ABDOMEN - 2 VIEW  COMPARISON:  08/19/2014  FINDINGS: Mild decrease in degree of small bowel dilatation seen compared to prior study. Some colonic gas is also noted. No evidence of free air. These findings are consistent with improving partial small bowel obstruction.  IMPRESSION: Mild improvement in partial small bowel obstruction.   Electronically Signed   By: Myles Rosenthal M.D.   On: 08/20/2014 10:52   Dg Abd 2 Views  08/19/2014   CLINICAL DATA:  Small bowel obstruction.  EXAM: ABDOMEN - 2 VIEW  COMPARISON:  August 16, 2014.  FINDINGS: Status post cholecystectomy. No colonic dilatation is noted. Stable mildly dilated small bowel loops are noted centrally within the abdomen concerning for possible partial distal small bowel obstruction. No free air is noted.  IMPRESSION: Stable mildly dilated small bowel loops are noted concerning for possible distal small bowel obstruction.   Electronically Signed   By: Lupita RaiderJames  Green Jr, M.D.   On: 08/19/2014 10:40   Dg Abd 2 Views  08/16/2014   CLINICAL DATA:  Follow-up small bowel obstruction  EXAM: ABDOMEN - 2 VIEW  COMPARISON:  08/15/2014  FINDINGS: Multiple dilated loops of small bowel in the central abdomen, compatible with partial small biopsy obstruction.  Contrast in the descending and sigmoid colon.  Cholecystectomy clips.  IMPRESSION: Dilated loops of small bowel in the central abdomen, compatible with  partial small bowel obstruction.  Contrast in the descending and sigmoid colon.   Electronically Signed   By: Charline BillsSriyesh  Krishnan M.D.   On: 08/16/2014 10:33   Dg Abd 2 Views  08/15/2014   CLINICAL DATA:  74 year old female with small bowel obstruction  EXAM: ABDOMEN - 2 VIEW  COMPARISON:  CT abdomen/ pelvis 08/14/2014  FINDINGS: Nasogastric tube well positioned in the stomach. Persistent numerous loops of air-filled and dilated small bowel with multiple small air-fluid levels on the upright radiograph. Comparing to the scout radiograph from yesterday CT scan, there has been no significant interval change. The colon remains relatively decompressed. Lung bases are clear. Stable cardiomegaly. Surgical clips in the right upper quadrant suggest prior cholecystectomy. Excreted contrast material opacifies the bladder. No acute osseous abnormality. 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  IMPRESSION: 1. Persistent small bowel obstruction without significant interval change. 2. Well-positioned nasogastric tube.   Electronically Signed   By: Malachy MoanHeath  McCullough M.D.   On: 08/15/2014 10:28    Microbiology: Recent Results (from the past 240 hour(s))  Surgical pcr screen     Status: None   Collection Time: 08/21/14  9:22 AM  Result Value Ref Range Status   MRSA, PCR NEGATIVE NEGATIVE Final   Staphylococcus aureus NEGATIVE NEGATIVE Final    Comment:        The Xpert SA Assay (FDA approved for NASAL specimens in patients over 76 years of age), is one component of a comprehensive surveillance program.  Test performance has been validated by [facility] for patients greater than or equal to 76 year old. It is not intended to diagnose infection nor to guide or monitor treatment.      Labs: Basic Metabolic Panel:  Recent Labs Lab 08/22/14 0700 08/23/14 0410 08/25/14 0402 08/26/14 0420 08/28/14 0551  NA 145 144 137 136 140  K 3.8 3.6 3.7 3.4* 4.0  CL 111  106 98 96 105  CO2 29 34* 33* 33* 28  GLUCOSE 131* 127* 114* 113* 114*  BUN 16 12 11 14 10   CREATININE 0.52 0.62 0.61 0.58 0.60  CALCIUM 8.0* 8.2* 8.4 7.8* 8.3*   Liver Function Tests:  Recent Labs Lab 08/22/14 0700  AST 28  ALT 53*  ALKPHOS 50  BILITOT 0.5  PROT 6.1  ALBUMIN 2.8*   No results for input(s): LIPASE, AMYLASE in the last 168 hours. No results for input(s): AMMONIA in the last 168 hours. CBC:  Recent Labs Lab 08/22/14 0700 08/23/14 0410 08/25/14 0402 08/26/14 0420  WBC 13.8* 11.9* 12.5* 10.0  NEUTROABS  --  10.2*  --   --   HGB 10.9* 10.4* 11.4* 11.2*  HCT 33.7* 32.8* 35.1* 34.1*  MCV 91.1 91.9 89.3 88.3  PLT 306 314 323 341   Cardiac Enzymes: No results for input(s): CKTOTAL, CKMB, CKMBINDEX, TROPONINI in the last 168 hours. BNP: BNP (last 3 results)  Recent Labs  08/23/14 0410  BNP 620.4*    ProBNP (last 3 results) No results for input(s): PROBNP in the last 8760 hours.  CBG: No results for input(s): GLUCAP in the last 168 hours.     Signed:  Marinda Elk  [facility] 08/28/2014, 9:31 AM

## 2014-08-28 NOTE — Progress Notes (Signed)
OT Cancellation Note  Patient Details Name: Jody Summers MRN: [id number] DOB: 1940/12/09   Cancelled Treatment:    Reason Eval/Treat Not Completed: Other (comment) Attempted X 2. Nursing and student nurse in with pt when OT first checked by. Came by later and pt up with student nurse for a walk. Will try back another time.  Lennox LaityStone, [place] Winsor Stafford  [phone] 08/28/2014, 2:01 PM

## 2014-08-28 NOTE — Discharge Instructions (Signed)
[facility]      [facility] [phone]  OPEN ABDOMINAL SURGERY: POST OP INSTRUCTIONS  Always review your discharge instruction sheet given to you by the facility where your surgery was performed.  IF YOU HAVE DISABILITY OR FAMILY LEAVE FORMS, YOU MUST BRING THEM TO THE OFFICE FOR PROCESSING.  PLEASE DO NOT GIVE THEM TO YOUR DOCTOR.  1. A prescription for pain medication may be given to you upon discharge.  Take your pain medication as prescribed, if needed.  If narcotic pain medicine is not needed, then you may take acetaminophen (Tylenol) or ibuprofen (Advil) as needed. 2. Take your usually prescribed medications unless otherwise directed. 3. If you need a refill on your pain medication, please contact your pharmacy. They will contact our office to request authorization.  Prescriptions will not be filled after 5pm or on week-ends. 4. You should follow a light diet the first few days after arrival home, such as soup and crackers, pudding, etc.unless your doctor has advised otherwise. A high-fiber, low fat diet can be resumed as tolerated.   Be sure to include lots of fluids daily. Most patients will experience some swelling and bruising on the chest and neck area.  Ice packs will help.  Swelling and bruising can take several days to resolve 5. Most patients will experience some swelling and bruising in the area of the incision. Ice pack will help. Swelling and bruising can take several days to resolve..  6. It is common to experience some constipation if taking pain medication after surgery.  Increasing fluid intake and taking a stool softener will usually help or prevent this problem from occurring.  A mild laxative (Milk of Magnesia or Miralax) should be taken according to package directions if there are no bowel movements after 48 hours. 7.  You may have steri-strips (small skin tapes) in place directly over the incision.  These strips should be left on the skin for 7-10 days.  If your  surgeon used skin glue on the incision, you may shower in 24 hours.  The glue will flake off over the next 2-3 weeks.  Any sutures or staples will be removed at the office during your follow-up visit. You may find that a light gauze bandage over your incision may keep your staples from being rubbed or pulled. You may shower and replace the bandage daily. 8. ACTIVITIES:  You may resume regular (light) daily activities beginning the next day--such as daily self-care, walking, climbing stairs--gradually increasing activities as tolerated.  You may have sexual intercourse when it is comfortable.  Refrain from any heavy lifting or straining until approved by your doctor. a. You may drive when you no longer are taking prescription pain medication, you can comfortably wear a seatbelt, and you can safely maneuver your car and apply brakes b. Return to Work: ___________________________________ 9. You should see your doctor in the office for a follow-up appointment approximately two weeks after your surgery.  Make sure that you call for this appointment within a day or two after you arrive home to insure a convenient appointment time. OTHER INSTRUCTIONS:  _____________________________________________________________ _____________________________________________________________  WHEN TO CALL YOUR DOCTOR: 1. Fever over 101.0 2. Inability to urinate 3. Nausea and/or vomiting 4. Extreme swelling or bruising 5. Continued bleeding from incision. 6. Increased pain, redness, or drainage from the incision. 7. Difficulty swallowing or breathing 8. Muscle cramping or spasms. 9. Numbness or tingling in hands or feet or around lips.  The clinic staff is available to   answer your questions during regular business hours.  Please don't hesitate to call and ask to speak to one of the nurses if you have concerns.  For further questions, please visit [email or web address]   

## 2014-08-28 NOTE — Progress Notes (Signed)
CARE MANAGEMENT NOTE 08/28/2014  Patient:  Jody Summers,Jody Summers   Account Number:  [id number]  Date Initiated:  08/18/2014  Documentation initiated by:  Lorenda IshiharaPEELE,SUZANNE  Subjective/Objective Assessment:   74 yo female admitted with SBO. PTA lived at home with spouse.     Action/Plan:   Home when stable   Anticipated DC Date:  08/27/2014   Anticipated DC Plan:  HOME W HOME HEALTH SERVICES      DC Planning Services  CM consult      Choice offered to / List presented to:  C-1 Patient   DME arranged  3-N-1  Levan HurstWALKER - ROLLING      [facility]     HH arranged  HH-2 PT  HH-3 OT      [facility]  [facility]   Status of service:  Completed, signed off Medicare Important Message given?  YES (If response is "NO", the following Medicare IM given date fields will be blank) Date Medicare IM given:  08/18/2014 Medicare IM given by:  [facility]SUZ[facility]NE Date Additional Medicare IM given:  08/27/2014 Additional Medicare IM given by:  Ferdinand CavaANDREA SCHETTINO  [facility]:  [facility]  Per UR Regulation:  Reviewed for med. necessity/level of care/duration of stay  If discussed at Long Length of Stay Meetings, dates discussed:    Comments:  08/28/14 Ferdinand CavaAndrea Schettino RN BSn CM [phone] [facility] patient [facility] and [facility]. Explained that [facility]. Also discussed purchased DME. The spouse stated that he decided to return the DME since it will be covered by insurance with a small copay. This CM explained that the DME will have to be ordered through [facility] and that I will request the DME be delivered to the hosptal prior to DC so the patient is safe for discharge since there is no one tha spouse can have receive the DME at the house. Also explained that if there is a copay it will have to be collected at time of delivery.Spoke with Chalmers CaterJames Bryant, with [facility], regarding faxed and ordered DME and will  continue to follow up on status. Approximate delievry time will be 2pm.  08/27/14 Ferdinand CavaAndrea Schettino RN BSN CM [phone] [facility], have communicated referral to [place], [facility]. Spouse stated that he ordered a rolling walker through [employer] which will be delivered today. Will continue to follow for qualifying home O2 needs.  08/26/14 Ferdinand CavaAndrea Schettino RN BSN CM [phone] Provided patient and spouse with [facility] choice. Spouse and patient state that they prefer to [facility] if recommended. Will continue to follow up with patient discharge needs.  Feb. 15 2016/Rhonda L. Earlene Plateravis, RN, BSN, CCM/Case Management [employer] [phone] No discharge needs present of time of review.   [id number]/Small bowel obstruction/exp.lap with lysis of adhensions/remains on vent post op/

## 2014-08-29 MED ORDER — LEVOFLOXACIN 750 MG PO TABS: 750.0000 mg | ORAL_TABLET | Freq: Every day | ORAL | Status: DC

## 2014-08-29 MED ORDER — POLYETHYLENE GLYCOL 3350 17 G PO PACK
17.0000 g | PACK | Freq: Once | ORAL | Status: AC
  Administered 2014-08-29: 17 g via ORAL
  Filled 2014-08-29: qty 1

## 2014-08-29 NOTE — Progress Notes (Signed)
Pt discharged to home. DC instructions given with spouse at bedside. No concerns voiced. prescription x 1 given for antibiotic.  Left unit in in wheelchair pushed by nurse tech accompanied by spouse. Left in good condition. Vwilliams,rn.

## 2014-08-29 NOTE — Progress Notes (Signed)
Key Points: Use following P&T approved IV to PO non-antibiotic change policy.  Description contains the criteria that are approved Note: Policy Excludes:  Esophagectomy patientsPHARMACIST - PHYSICIAN COMMUNICATION DR:   Robb Matarrtiz CONCERNING: IV to Oral Route Change Policy  RECOMMENDATION: This patient is receiving Levaquin by the intravenous route.  Based on criteria approved by the Pharmacy and Therapeutics Committee, the intravenous medication(s) is/are being converted to the equivalent oral dose form(s).   DESCRIPTION: These criteria include:  The patient is eating (either orally or via tube) and/or has been taking other orally administered medications for a least 24 hours  The patient has no evidence of active gastrointestinal bleeding or impaired GI absorption (gastrectomy, short bowel, patient on TNA or NPO).  If you have questions about this conversion, please contact the Pharmacy Department  []   [phone]( [phone] )  [facility] []   [phone]( [phone] )  [facility]  []   [phone]( [phone] )  [facility] [x]   [phone]( [phone] )  [facility]  Earl ManyLegge, Willi [place] RirieM[place], [facility] 08/29/2014 10:50 AM

## 2014-08-29 NOTE — Progress Notes (Signed)
[facility] PROGRESS NOTE Interim History: 74 y.o. female was admitted on 08/15/14 for treatment of small bowel obstruction. She was initially treated with conservative management, but underwent an exploratory lap with lysis of adhesions on 08/21/14 secondary to lack of clinical improvement. She was hypoxic postoperatively and maintained on full ventilator support with subsequent successful weaning by PCCM. Hospital course has been complicated by aspiration PNA.   Assessment/Plan: SOB (shortness of breath) - S/p exploratory lap with lysis of adhesion on 12.12.16, Ng tube d/c on 2.14.2016. - Tolerating diet, small BM.  Acute respiratory failure with hypoxia due to  Aspiration pneumonia and pulmonary edema: - Wean of vent post operatively. - will need 1 more day of lasix.   Hypernatremia - resolved with hypotonic solution.  Hypokalemia  Normocytic anemia:  Hypothyroidism: - cont synthroid.  Essential hypertension - cont PRN medications    Code Status: full Family Communication: Husband Disposition Plan:    Consultants:  Dr. Chevis Pretty, Surgery  Dr. Max Fickle, PCCM  Procedures: Ct Abdomen Pelvis W Contrast 08/14/2014 1. CT findings consistent with a small-bowel obstruction likely due to pelvic adhesions. 2. Dilatation of the mid appendix which is fluid-filled. This may be due to fluid in the cecum. I do not see any significant periappendiceal inflammatory change but recommend correlation with clinical findings and close clinical followup.   Dg Abd 2 Views 08/15/2014 1. Persistent small bowel obstruction without significant interval change. 2. Well-positioned nasogastric tube.  Dg Abd 2 Views 08/16/2014 Dilated loops of small bowel in the central abdomen, compatible with partial small bowel obstruction. Contrast in the descending and sigmoid colon.   Dg Abd 2 Views 08/19/2014 Stable mildly dilated small bowel loops are noted concerning for possible distal  small bowel obstruction.   Dg Abd 2 Views 08/20/2014 : Mild improvement in partial small bowel obstruction.   Dg Chest 2 View 08/21/2014: Asymmetric airspace disease favoring pneumonia. Small bilateral pleural effusions.  Dg Abd 2 Views 08/21/2014: Resolving obstruction pattern. No free air.   Dg Chest Port 1 View 08/23/2014: Increased airspace filling bilaterally.   Dg Chest Port 1 View 08/25/2014: Diffuse parenchymal lung opacities throughout both lungs.   Antibiotics:  Levaquin  HPI/Subjective: Passing gass, small BM  Objective: Filed Vitals:   08/28/14 2010 08/28/14 2149 08/29/14 0605 08/29/14 0817  BP:  114/57 129/49   Pulse:  90 84   Temp:  98 F (36.7 C) 98.2 F (36.8 C)   TempSrc:  Oral Oral   Resp:  18 18   Height:      Weight:      SpO2: 98% 95% 95% 92%    Intake/Output Summary (Last 24 hours) at 08/29/14 0852 Last data filed at 08/29/14 0700  Gross per 24 hour  Intake    719 ml  Output      0 ml  Net    719 ml   Filed Weights   08/23/14 0400 08/24/14 0400 08/25/14 0400  Weight: 72 kg (158 lb 11.7 oz) 68.8 kg (151 lb 10.8 oz) 65.9 kg (145 lb 4.5 oz)    Exam:  General: Alert, awake, oriented x3, in no acute distress.  HEENT: No bruits, no goiter. -JVD Heart: Regular rate and rhythm. Lungs: Good air movement, clear Abdomen: Soft, nontender, nondistended, positive bowel sounds.  Neuro: Grossly intact, nonfocal.   Data Reviewed: Basic Metabolic Panel:  Recent Labs Lab 08/23/14 0410 08/25/14 0402 08/26/14 0420 08/28/14 0551  NA 144 137 136 140  K 3.6 3.7  3.4* 4.0  CL 106 98 96 105  CO2 34* 33* 33* 28  GLUCOSE 127* 114* 113* 114*  BUN 12 11 14 10   CREATININE 0.62 0.61 0.58 0.60  CALCIUM 8.2* 8.4 7.8* 8.3*   Liver Function Tests: No results for input(s): AST, ALT, ALKPHOS, BILITOT, PROT, ALBUMIN in the last 168 hours. No results for input(s): LIPASE, AMYLASE in the last 168 hours. No results for input(s): AMMONIA in the last 168  hours. CBC:  Recent Labs Lab 08/23/14 0410 08/25/14 0402 08/26/14 0420  WBC 11.9* 12.5* 10.0  NEUTROABS 10.2*  --   --   HGB 10.4* 11.4* 11.2*  HCT 32.8* 35.1* 34.1*  MCV 91.9 89.3 88.3  PLT 314 323 341   Cardiac Enzymes: No results for input(s): CKTOTAL, CKMB, CKMBINDEX, TROPONINI in the last 168 hours. BNP (last 3 results)  Recent Labs  08/23/14 0410  BNP 620.4*    ProBNP (last 3 results) No results for input(s): PROBNP in the last 8760 hours.  CBG: No results for input(s): GLUCAP in the last 168 hours.  Recent Results (from the past 240 hour(s))  Surgical pcr screen     Status: None   Collection Time: 08/21/14  9:22 AM  Result Value Ref Range Status   MRSA, PCR NEGATIVE NEGATIVE Final   Staphylococcus aureus NEGATIVE NEGATIVE Final    Comment:        The Xpert SA Assay (FDA approved for NASAL specimens in patients over 76 years of age), is one component of a comprehensive surveillance program.  Test performance has been validated by [facility] for patients greater than or equal to 76 year old. It is not intended to diagnose infection nor to guide or monitor treatment.      Studies: No results found.  Scheduled Meds: . dextromethorphan  30 mg Oral BID  . estradiol  0.05 mg Transdermal Weekly  . feeding supplement (RESOURCE BREEZE)  1 Container Oral TID BM  . heparin subcutaneous  5,000 Units Subcutaneous 3 times per day  . ipratropium-albuterol  3 mL Nebulization BID  . levofloxacin (LEVAQUIN) IV  750 mg Intravenous Q24H  . levothyroxine  25 mcg Oral Once per day on Sun Mon Wed Thu Fri  . polyethylene glycol  17 g Oral Once   Continuous Infusions:     Marinda ElkFELIZ ORTIZ, ABRAHAM  [facility] Pager [phone]. If 8PM-8AM, please contact night-coverage at [email or web address], password [email or web address] 08/29/2014, 8:52 AM  LOS: 14 days

## 2014-08-31 DIAGNOSIS — K565 Intestinal adhesions [bands] with obstruction (postprocedural) (postinfection): Secondary | ICD-10-CM | POA: Diagnosis not present

## 2014-08-31 DIAGNOSIS — J9601 Acute respiratory failure with hypoxia: Secondary | ICD-10-CM | POA: Diagnosis not present

## 2014-08-31 DIAGNOSIS — J698 Pneumonitis due to inhalation of other solids and liquids: Secondary | ICD-10-CM | POA: Diagnosis not present

## 2014-08-31 DIAGNOSIS — B9562 Methicillin resistant Staphylococcus aureus infection as the cause of diseases classified elsewhere: Secondary | ICD-10-CM | POA: Diagnosis not present

## 2014-08-31 DIAGNOSIS — I10 Essential (primary) hypertension: Secondary | ICD-10-CM | POA: Diagnosis not present

## 2014-08-31 DIAGNOSIS — Z48815 Encounter for surgical aftercare following surgery on the digestive system: Secondary | ICD-10-CM | POA: Diagnosis not present

## 2015-02-24 DIAGNOSIS — G5602 Carpal tunnel syndrome, left upper limb: Secondary | ICD-10-CM | POA: Diagnosis not present

## 2015-02-24 DIAGNOSIS — M79642 Pain in left hand: Secondary | ICD-10-CM | POA: Diagnosis not present

## 2015-02-24 DIAGNOSIS — M65312 Trigger thumb, left thumb: Secondary | ICD-10-CM | POA: Diagnosis not present

## 2015-02-24 DIAGNOSIS — M79645 Pain in left finger(s): Secondary | ICD-10-CM | POA: Diagnosis not present

## 2015-03-26 DIAGNOSIS — G5602 Carpal tunnel syndrome, left upper limb: Secondary | ICD-10-CM | POA: Diagnosis not present

## 2015-04-02 DIAGNOSIS — R2231 Localized swelling, mass and lump, right upper limb: Secondary | ICD-10-CM | POA: Diagnosis not present

## 2015-04-02 DIAGNOSIS — M25531 Pain in right wrist: Secondary | ICD-10-CM | POA: Diagnosis not present

## 2015-04-09 DIAGNOSIS — S6412XD Injury of median nerve at wrist and hand level of left arm, subsequent encounter: Secondary | ICD-10-CM | POA: Diagnosis not present

## 2015-04-17 DIAGNOSIS — Z23 Encounter for immunization: Secondary | ICD-10-CM | POA: Diagnosis not present

## 2015-04-21 DIAGNOSIS — S6412XD Injury of median nerve at wrist and hand level of left arm, subsequent encounter: Secondary | ICD-10-CM | POA: Diagnosis not present

## 2015-04-22 DIAGNOSIS — H04123 Dry eye syndrome of bilateral lacrimal glands: Secondary | ICD-10-CM | POA: Diagnosis not present

## 2015-04-22 DIAGNOSIS — H524 Presbyopia: Secondary | ICD-10-CM | POA: Diagnosis not present

## 2015-04-22 DIAGNOSIS — H2513 Age-related nuclear cataract, bilateral: Secondary | ICD-10-CM | POA: Diagnosis not present

## 2015-04-30 DIAGNOSIS — I10 Essential (primary) hypertension: Secondary | ICD-10-CM | POA: Diagnosis not present

## 2015-04-30 DIAGNOSIS — Z9889 Other specified postprocedural states: Secondary | ICD-10-CM | POA: Diagnosis not present

## 2015-04-30 DIAGNOSIS — E781 Pure hyperglyceridemia: Secondary | ICD-10-CM | POA: Diagnosis not present

## 2015-04-30 DIAGNOSIS — Z6822 Body mass index (BMI) 22.0-22.9, adult: Secondary | ICD-10-CM | POA: Diagnosis not present

## 2015-04-30 DIAGNOSIS — R159 Full incontinence of feces: Secondary | ICD-10-CM | POA: Diagnosis not present

## 2015-04-30 DIAGNOSIS — E039 Hypothyroidism, unspecified: Secondary | ICD-10-CM | POA: Diagnosis not present

## 2015-08-25 DIAGNOSIS — M1711 Unilateral primary osteoarthritis, right knee: Secondary | ICD-10-CM | POA: Diagnosis not present

## 2015-08-25 DIAGNOSIS — M545 Low back pain: Secondary | ICD-10-CM | POA: Diagnosis not present

## 2015-09-15 DIAGNOSIS — M545 Low back pain: Secondary | ICD-10-CM | POA: Diagnosis not present

## 2015-09-15 DIAGNOSIS — M1711 Unilateral primary osteoarthritis, right knee: Secondary | ICD-10-CM | POA: Diagnosis not present

## 2015-11-01 DIAGNOSIS — E038 Other specified hypothyroidism: Secondary | ICD-10-CM | POA: Diagnosis not present

## 2015-11-01 DIAGNOSIS — I10 Essential (primary) hypertension: Secondary | ICD-10-CM | POA: Diagnosis not present

## 2015-11-01 DIAGNOSIS — M859 Disorder of bone density and structure, unspecified: Secondary | ICD-10-CM | POA: Diagnosis not present

## 2015-11-01 DIAGNOSIS — E781 Pure hyperglyceridemia: Secondary | ICD-10-CM | POA: Diagnosis not present

## 2015-11-08 DIAGNOSIS — F1320 Sedative, hypnotic or anxiolytic dependence, uncomplicated: Secondary | ICD-10-CM | POA: Diagnosis not present

## 2015-11-08 DIAGNOSIS — Z79899 Other long term (current) drug therapy: Secondary | ICD-10-CM | POA: Diagnosis not present

## 2015-11-08 DIAGNOSIS — R159 Full incontinence of feces: Secondary | ICD-10-CM | POA: Diagnosis not present

## 2015-11-08 DIAGNOSIS — H8110 Benign paroxysmal vertigo, unspecified ear: Secondary | ICD-10-CM | POA: Diagnosis not present

## 2015-11-08 DIAGNOSIS — Z0000 Encounter for general adult medical examination without abnormal findings: Secondary | ICD-10-CM | POA: Diagnosis not present

## 2015-11-08 DIAGNOSIS — I10 Essential (primary) hypertension: Secondary | ICD-10-CM | POA: Diagnosis not present

## 2015-11-08 DIAGNOSIS — F419 Anxiety disorder, unspecified: Secondary | ICD-10-CM | POA: Diagnosis not present

## 2015-11-08 DIAGNOSIS — I868 Varicose veins of other specified sites: Secondary | ICD-10-CM | POA: Diagnosis not present

## 2015-11-08 DIAGNOSIS — K648 Other hemorrhoids: Secondary | ICD-10-CM | POA: Diagnosis not present

## 2015-11-24 DIAGNOSIS — Z1231 Encounter for screening mammogram for malignant neoplasm of breast: Secondary | ICD-10-CM | POA: Diagnosis not present

## 2015-11-24 DIAGNOSIS — Z803 Family history of malignant neoplasm of breast: Secondary | ICD-10-CM | POA: Diagnosis not present

## 2016-03-20 DIAGNOSIS — M859 Disorder of bone density and structure, unspecified: Secondary | ICD-10-CM | POA: Diagnosis not present

## 2016-04-08 DIAGNOSIS — Z23 Encounter for immunization: Secondary | ICD-10-CM | POA: Diagnosis not present

## 2016-04-27 DIAGNOSIS — H524 Presbyopia: Secondary | ICD-10-CM | POA: Diagnosis not present

## 2016-04-27 DIAGNOSIS — H5203 Hypermetropia, bilateral: Secondary | ICD-10-CM | POA: Diagnosis not present

## 2016-04-27 DIAGNOSIS — H52223 Regular astigmatism, bilateral: Secondary | ICD-10-CM | POA: Diagnosis not present

## 2016-04-27 DIAGNOSIS — H2513 Age-related nuclear cataract, bilateral: Secondary | ICD-10-CM | POA: Diagnosis not present

## 2016-04-27 DIAGNOSIS — H04123 Dry eye syndrome of bilateral lacrimal glands: Secondary | ICD-10-CM | POA: Diagnosis not present

## 2016-05-15 DIAGNOSIS — E038 Other specified hypothyroidism: Secondary | ICD-10-CM | POA: Diagnosis not present

## 2016-05-15 DIAGNOSIS — F1320 Sedative, hypnotic or anxiolytic dependence, uncomplicated: Secondary | ICD-10-CM | POA: Diagnosis not present

## 2016-05-15 DIAGNOSIS — M1990 Unspecified osteoarthritis, unspecified site: Secondary | ICD-10-CM | POA: Diagnosis not present

## 2016-05-15 DIAGNOSIS — M859 Disorder of bone density and structure, unspecified: Secondary | ICD-10-CM | POA: Diagnosis not present

## 2016-05-15 DIAGNOSIS — I10 Essential (primary) hypertension: Secondary | ICD-10-CM | POA: Diagnosis not present

## 2016-05-15 DIAGNOSIS — Z6821 Body mass index (BMI) 21.0-21.9, adult: Secondary | ICD-10-CM | POA: Diagnosis not present

## 2016-05-15 DIAGNOSIS — F418 Other specified anxiety disorders: Secondary | ICD-10-CM | POA: Diagnosis not present

## 2016-07-28 IMAGING — CT CT ABD-PELV W/ CM
1 of 3 series · 13 of 32 positions shown, 18 images · IV contrast (OMNIPAQUE 300)
Comparison: None.

CLINICAL DATA: Two-day history of abdominal pain with diarrhea and
vomiting.

EXAM:
CT ABDOMEN AND PELVIS WITH CONTRAST
TECHNIQUE: Multidetector CT imaging of the abdomen and pelvis was performed
using the standard protocol following bolus administration of
intravenous contrast.
CONTRAST:  100mL OMNIPAQUE IOHEXOL 300 MG/ML  SOLN

[Series 2: abd/pel with · axial · 0.70mm/px · z∈[-453,-53]mm · 13 of 90 slices shown, 18 images]
[im 5/90  soft-tissue]
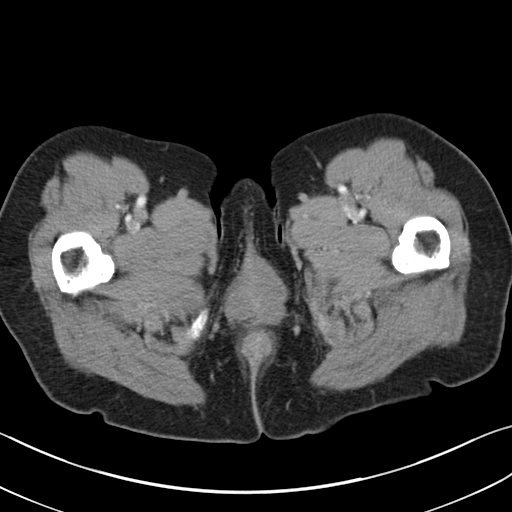
[im 5/90  bone]
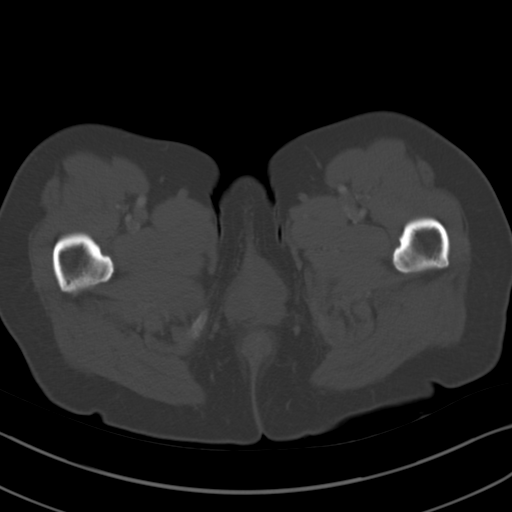
[im 15/90  soft-tissue]
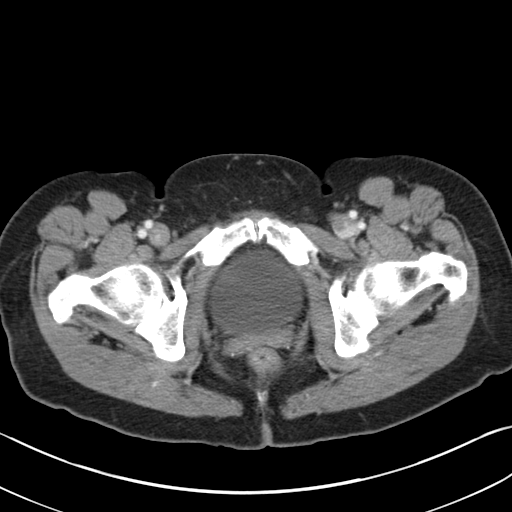
[im 20/90  soft-tissue]
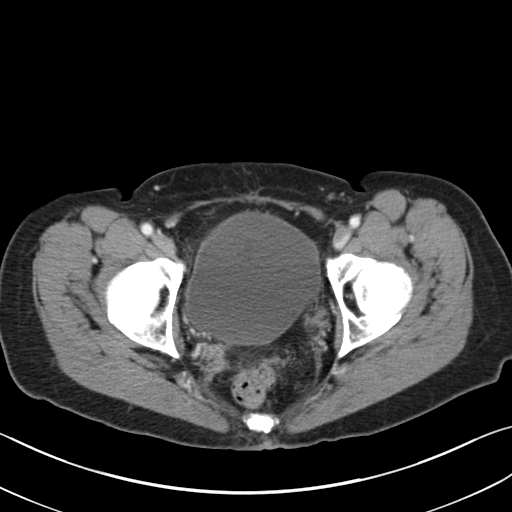
[im 25/90  soft-tissue]
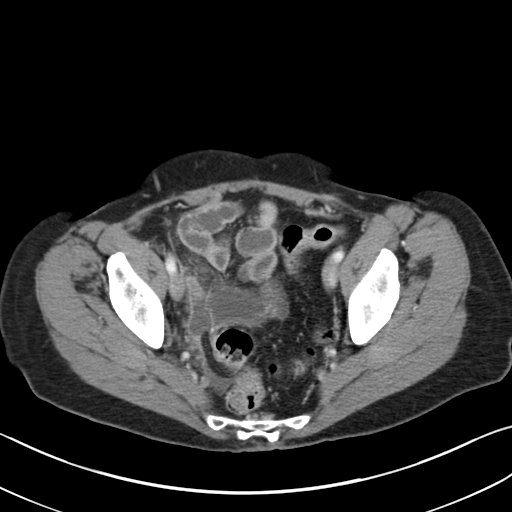
[im 35/90  soft-tissue]
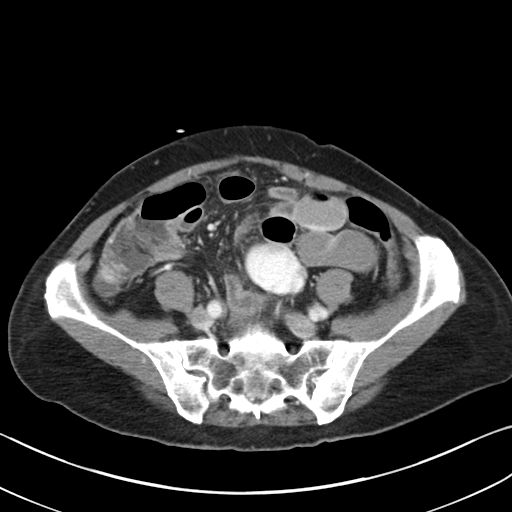
[im 40/90  soft-tissue]
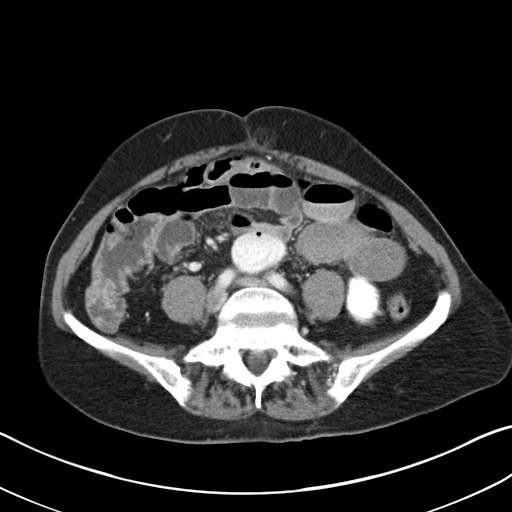
[im 50/90  soft-tissue]
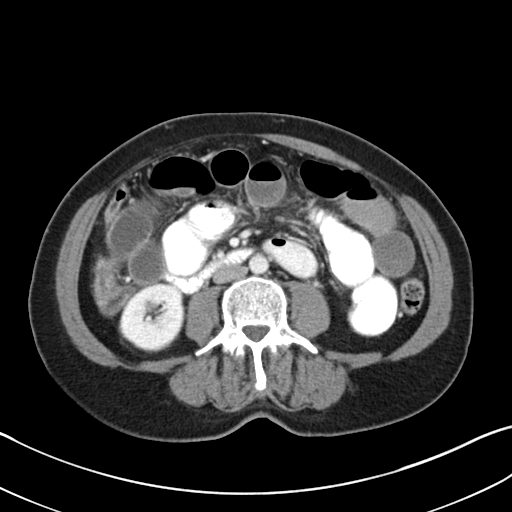
[im 55/90  soft-tissue]
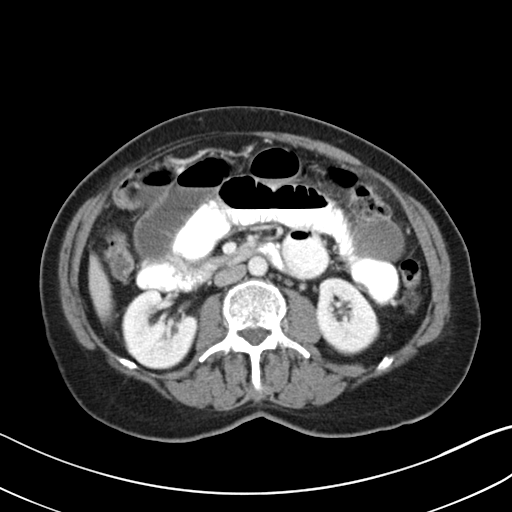
[im 65/90  soft-tissue]
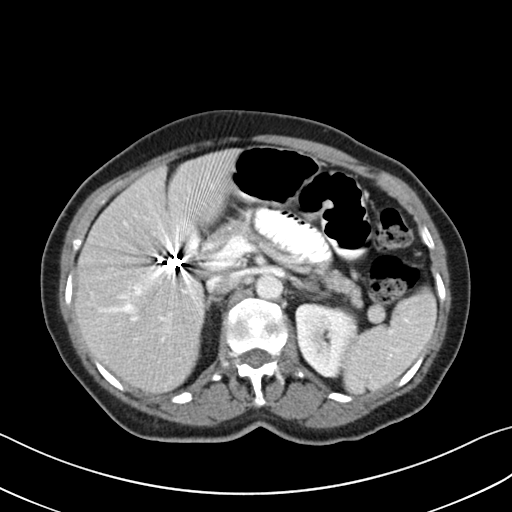
[im 65/90  bone]
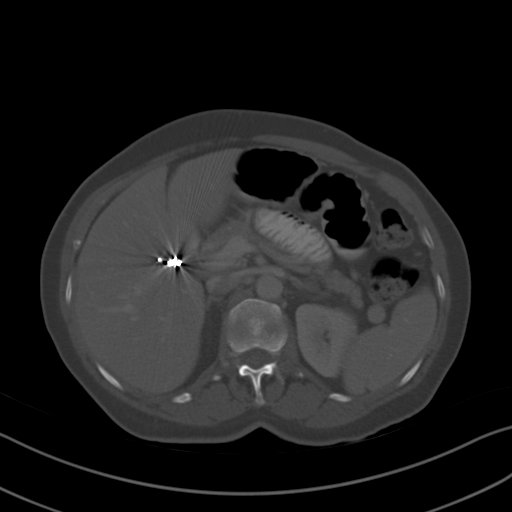
[im 70/90  soft-tissue]
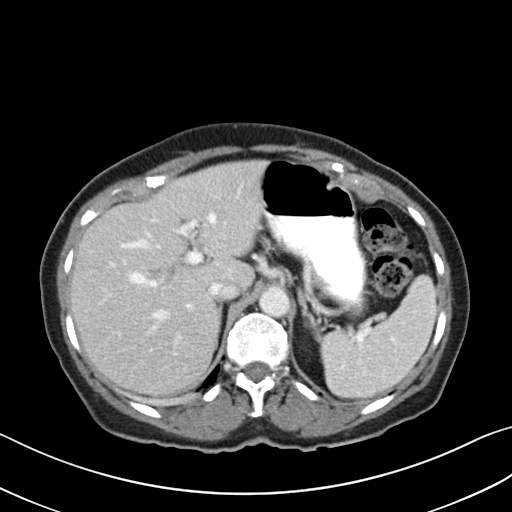
[im 70/90  lung]
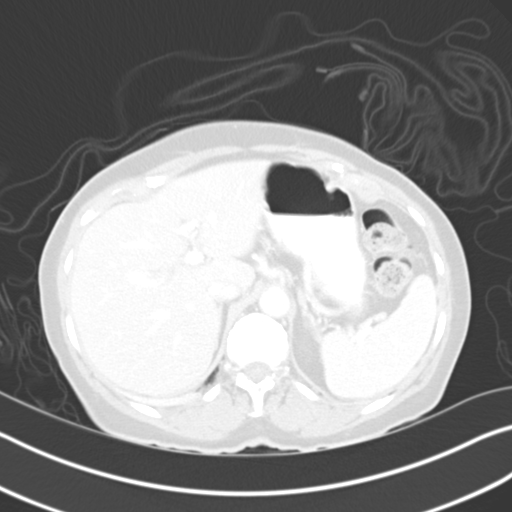
[im 75/90  soft-tissue]
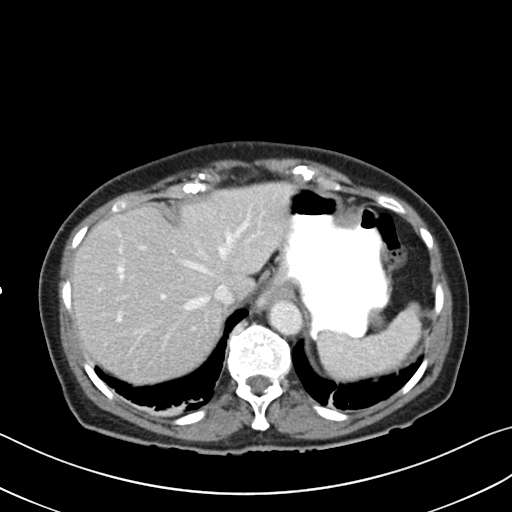
[im 75/90  lung]
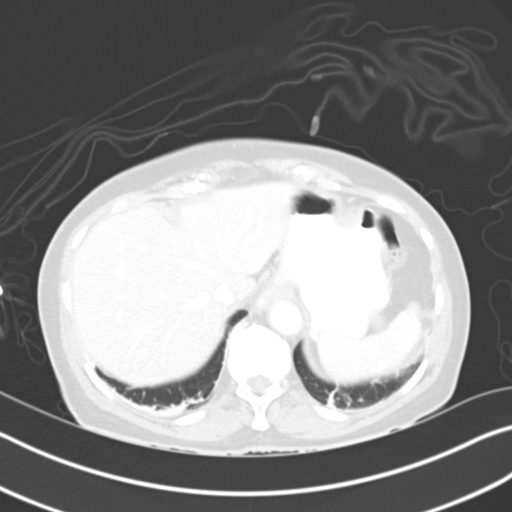
[im 80/90  lung]
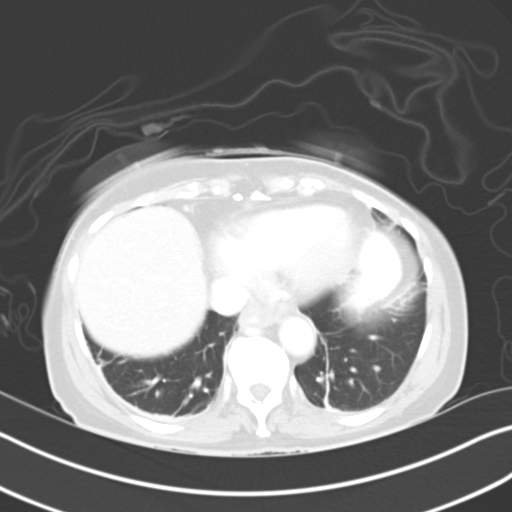
[im 85/90  soft-tissue]
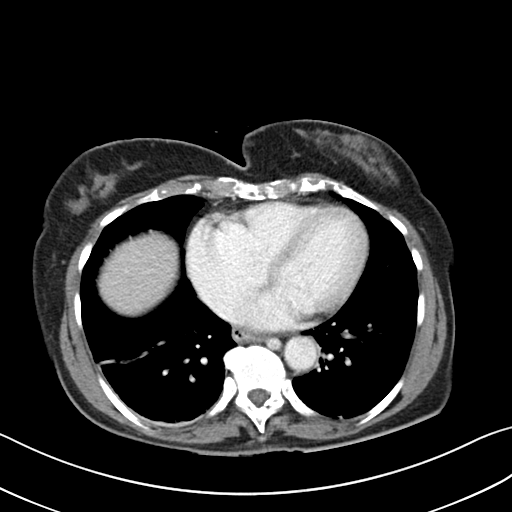
[im 85/90  lung]
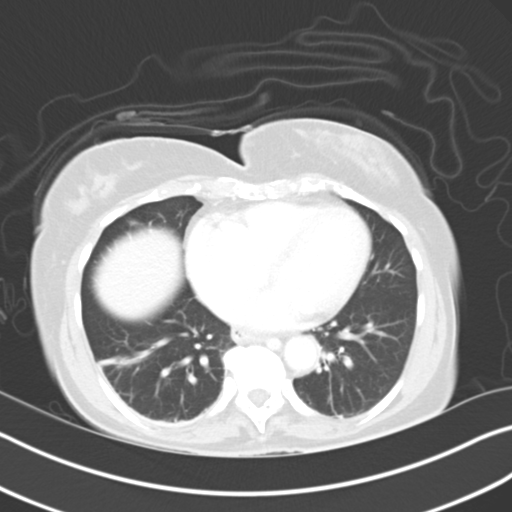

[13 of 32 positions shown; findings below may reference images not displayed]

FINDINGS: Lower chest: Dependent atelectasis and scarring changes. No pleural
effusion. The heart is normal in size. No pericardial effusion. The
distal esophagus is grossly normal. There is a small hiatal hernia.

Hepatobiliary: No focal hepatic lesions or intrahepatic biliary
dilatation. The gallbladder is surgically absent. No common bowel
duct dilatation.

Pancreas: Normal

Spleen: Normal

Adrenals/Urinary Tract: Normal

Stomach/Bowel: The stomach and duodenum are unremarkable. The small
bowel is dilated with air-fluid levels. Stable areas of caliber
change in the pelvis with a decompressed/ normal-appearing terminal
ileum. Findings most likely due to pelvic adhesions. The midportion
of the appendix is dilated and fluid-filled. The proximal and distal
aspects of the appendix appears normal in caliber. There is mild
mucosal enhancement. No significant inflammatory changes.

Vascular/Lymphatic: No mesenteric or retroperitoneal mass or
adenopathy. Small scattered lymph nodes are noted. The aorta is
normal in caliber. The branch vessels are patent. The major venous
structures are patent.

Reproductive: The uterus is surgically absent. The ovaries are not
identified. The bladder is normal. No pelvic mass or adenopathy.
There is a small amount of free pelvic fluid. No inguinal mass or
adenopathy.

Other: No abdominal wall hernia or subcutaneous lesions

Musculoskeletal: No significant bony findings.
IMPRESSION: 1. CT findings consistent with a small-bowel obstruction likely due
to pelvic adhesions.
2. Dilatation of the mid appendix which is fluid-filled. This may be
due to fluid in the cecum. I do not see any significant
periappendiceal inflammatory change but recommend correlation with
clinical findings and close clinical followup.

## 2016-11-09 DIAGNOSIS — M859 Disorder of bone density and structure, unspecified: Secondary | ICD-10-CM | POA: Diagnosis not present

## 2016-11-09 DIAGNOSIS — E038 Other specified hypothyroidism: Secondary | ICD-10-CM | POA: Diagnosis not present

## 2016-11-09 DIAGNOSIS — E781 Pure hyperglyceridemia: Secondary | ICD-10-CM | POA: Diagnosis not present

## 2016-11-09 DIAGNOSIS — I10 Essential (primary) hypertension: Secondary | ICD-10-CM | POA: Diagnosis not present

## 2016-11-09 DIAGNOSIS — R808 Other proteinuria: Secondary | ICD-10-CM | POA: Diagnosis not present

## 2016-11-16 DIAGNOSIS — Z0000 Encounter for general adult medical examination without abnormal findings: Secondary | ICD-10-CM | POA: Diagnosis not present

## 2016-11-16 DIAGNOSIS — M1990 Unspecified osteoarthritis, unspecified site: Secondary | ICD-10-CM | POA: Diagnosis not present

## 2016-11-16 DIAGNOSIS — E038 Other specified hypothyroidism: Secondary | ICD-10-CM | POA: Diagnosis not present

## 2016-11-16 DIAGNOSIS — I10 Essential (primary) hypertension: Secondary | ICD-10-CM | POA: Diagnosis not present

## 2016-11-16 DIAGNOSIS — N182 Chronic kidney disease, stage 2 (mild): Secondary | ICD-10-CM | POA: Diagnosis not present

## 2016-11-16 DIAGNOSIS — R808 Other proteinuria: Secondary | ICD-10-CM | POA: Diagnosis not present

## 2016-11-16 DIAGNOSIS — F418 Other specified anxiety disorders: Secondary | ICD-10-CM | POA: Diagnosis not present

## 2016-11-16 DIAGNOSIS — F1320 Sedative, hypnotic or anxiolytic dependence, uncomplicated: Secondary | ICD-10-CM | POA: Diagnosis not present

## 2016-11-16 DIAGNOSIS — M859 Disorder of bone density and structure, unspecified: Secondary | ICD-10-CM | POA: Diagnosis not present

## 2016-11-17 DIAGNOSIS — Z1212 Encounter for screening for malignant neoplasm of rectum: Secondary | ICD-10-CM | POA: Diagnosis not present

## 2016-11-22 DIAGNOSIS — Z1231 Encounter for screening mammogram for malignant neoplasm of breast: Secondary | ICD-10-CM | POA: Diagnosis not present

## 2016-11-22 DIAGNOSIS — Z803 Family history of malignant neoplasm of breast: Secondary | ICD-10-CM | POA: Diagnosis not present

## 2017-04-21 DIAGNOSIS — Z23 Encounter for immunization: Secondary | ICD-10-CM | POA: Diagnosis not present

## 2017-05-03 DIAGNOSIS — H524 Presbyopia: Secondary | ICD-10-CM | POA: Diagnosis not present

## 2017-05-03 DIAGNOSIS — H04123 Dry eye syndrome of bilateral lacrimal glands: Secondary | ICD-10-CM | POA: Diagnosis not present

## 2017-05-03 DIAGNOSIS — H2513 Age-related nuclear cataract, bilateral: Secondary | ICD-10-CM | POA: Diagnosis not present

## 2017-05-03 DIAGNOSIS — H353112 Nonexudative age-related macular degeneration, right eye, intermediate dry stage: Secondary | ICD-10-CM | POA: Diagnosis not present

## 2017-05-03 DIAGNOSIS — H52223 Regular astigmatism, bilateral: Secondary | ICD-10-CM | POA: Diagnosis not present

## 2017-05-03 DIAGNOSIS — H353121 Nonexudative age-related macular degeneration, left eye, early dry stage: Secondary | ICD-10-CM | POA: Diagnosis not present

## 2017-05-03 DIAGNOSIS — H5203 Hypermetropia, bilateral: Secondary | ICD-10-CM | POA: Diagnosis not present

## 2017-05-18 DIAGNOSIS — Z6824 Body mass index (BMI) 24.0-24.9, adult: Secondary | ICD-10-CM | POA: Diagnosis not present

## 2017-05-18 DIAGNOSIS — E038 Other specified hypothyroidism: Secondary | ICD-10-CM | POA: Diagnosis not present

## 2017-05-18 DIAGNOSIS — M1990 Unspecified osteoarthritis, unspecified site: Secondary | ICD-10-CM | POA: Diagnosis not present

## 2017-05-18 DIAGNOSIS — I10 Essential (primary) hypertension: Secondary | ICD-10-CM | POA: Diagnosis not present

## 2017-11-12 DIAGNOSIS — M859 Disorder of bone density and structure, unspecified: Secondary | ICD-10-CM | POA: Diagnosis not present

## 2017-11-12 DIAGNOSIS — I10 Essential (primary) hypertension: Secondary | ICD-10-CM | POA: Diagnosis not present

## 2017-11-12 DIAGNOSIS — R82998 Other abnormal findings in urine: Secondary | ICD-10-CM | POA: Diagnosis not present

## 2017-11-12 DIAGNOSIS — E038 Other specified hypothyroidism: Secondary | ICD-10-CM | POA: Diagnosis not present

## 2017-11-19 DIAGNOSIS — F1320 Sedative, hypnotic or anxiolytic dependence, uncomplicated: Secondary | ICD-10-CM | POA: Diagnosis not present

## 2017-11-19 DIAGNOSIS — M859 Disorder of bone density and structure, unspecified: Secondary | ICD-10-CM | POA: Diagnosis not present

## 2017-11-19 DIAGNOSIS — F418 Other specified anxiety disorders: Secondary | ICD-10-CM | POA: Diagnosis not present

## 2017-11-19 DIAGNOSIS — N182 Chronic kidney disease, stage 2 (mild): Secondary | ICD-10-CM | POA: Diagnosis not present

## 2017-11-19 DIAGNOSIS — E038 Other specified hypothyroidism: Secondary | ICD-10-CM | POA: Diagnosis not present

## 2017-11-19 DIAGNOSIS — I10 Essential (primary) hypertension: Secondary | ICD-10-CM | POA: Diagnosis not present

## 2017-11-19 DIAGNOSIS — M1990 Unspecified osteoarthritis, unspecified site: Secondary | ICD-10-CM | POA: Diagnosis not present

## 2017-11-19 DIAGNOSIS — R808 Other proteinuria: Secondary | ICD-10-CM | POA: Diagnosis not present

## 2017-11-19 DIAGNOSIS — Z0000 Encounter for general adult medical examination without abnormal findings: Secondary | ICD-10-CM | POA: Diagnosis not present

## 2017-11-22 DIAGNOSIS — Z1212 Encounter for screening for malignant neoplasm of rectum: Secondary | ICD-10-CM | POA: Diagnosis not present

## 2017-12-04 DIAGNOSIS — Z803 Family history of malignant neoplasm of breast: Secondary | ICD-10-CM | POA: Diagnosis not present

## 2017-12-04 DIAGNOSIS — Z1231 Encounter for screening mammogram for malignant neoplasm of breast: Secondary | ICD-10-CM | POA: Diagnosis not present

## 2018-04-13 DIAGNOSIS — Z23 Encounter for immunization: Secondary | ICD-10-CM | POA: Diagnosis not present

## 2018-05-08 DIAGNOSIS — H2513 Age-related nuclear cataract, bilateral: Secondary | ICD-10-CM | POA: Diagnosis not present

## 2018-05-08 DIAGNOSIS — H353112 Nonexudative age-related macular degeneration, right eye, intermediate dry stage: Secondary | ICD-10-CM | POA: Diagnosis not present

## 2018-05-08 DIAGNOSIS — H04123 Dry eye syndrome of bilateral lacrimal glands: Secondary | ICD-10-CM | POA: Diagnosis not present

## 2018-05-08 DIAGNOSIS — H353121 Nonexudative age-related macular degeneration, left eye, early dry stage: Secondary | ICD-10-CM | POA: Diagnosis not present

## 2018-05-27 DIAGNOSIS — Z1389 Encounter for screening for other disorder: Secondary | ICD-10-CM | POA: Diagnosis not present

## 2018-05-27 DIAGNOSIS — I10 Essential (primary) hypertension: Secondary | ICD-10-CM | POA: Diagnosis not present

## 2018-05-27 DIAGNOSIS — E038 Other specified hypothyroidism: Secondary | ICD-10-CM | POA: Diagnosis not present

## 2018-05-27 DIAGNOSIS — M8580 Other specified disorders of bone density and structure, unspecified site: Secondary | ICD-10-CM | POA: Diagnosis not present

## 2018-05-27 DIAGNOSIS — Z6824 Body mass index (BMI) 24.0-24.9, adult: Secondary | ICD-10-CM | POA: Diagnosis not present

## 2018-05-27 DIAGNOSIS — F419 Anxiety disorder, unspecified: Secondary | ICD-10-CM | POA: Diagnosis not present

## 2018-05-27 DIAGNOSIS — M1990 Unspecified osteoarthritis, unspecified site: Secondary | ICD-10-CM | POA: Diagnosis not present

## 2018-07-04 DIAGNOSIS — H6981 Other specified disorders of Eustachian tube, right ear: Secondary | ICD-10-CM | POA: Diagnosis not present

## 2018-07-04 DIAGNOSIS — H6591 Unspecified nonsuppurative otitis media, right ear: Secondary | ICD-10-CM | POA: Diagnosis not present

## 2018-07-04 DIAGNOSIS — Z6823 Body mass index (BMI) 23.0-23.9, adult: Secondary | ICD-10-CM | POA: Diagnosis not present

## 2018-07-24 DIAGNOSIS — M859 Disorder of bone density and structure, unspecified: Secondary | ICD-10-CM | POA: Diagnosis not present

## 2018-11-18 DIAGNOSIS — I10 Essential (primary) hypertension: Secondary | ICD-10-CM | POA: Diagnosis not present

## 2018-11-18 DIAGNOSIS — M859 Disorder of bone density and structure, unspecified: Secondary | ICD-10-CM | POA: Diagnosis not present

## 2018-11-18 DIAGNOSIS — E781 Pure hyperglyceridemia: Secondary | ICD-10-CM | POA: Diagnosis not present

## 2018-11-18 DIAGNOSIS — E038 Other specified hypothyroidism: Secondary | ICD-10-CM | POA: Diagnosis not present

## 2018-11-19 DIAGNOSIS — I10 Essential (primary) hypertension: Secondary | ICD-10-CM | POA: Diagnosis not present

## 2018-11-19 DIAGNOSIS — R82998 Other abnormal findings in urine: Secondary | ICD-10-CM | POA: Diagnosis not present

## 2018-11-25 DIAGNOSIS — M1990 Unspecified osteoarthritis, unspecified site: Secondary | ICD-10-CM | POA: Diagnosis not present

## 2018-11-25 DIAGNOSIS — F419 Anxiety disorder, unspecified: Secondary | ICD-10-CM | POA: Diagnosis not present

## 2018-11-25 DIAGNOSIS — H269 Unspecified cataract: Secondary | ICD-10-CM | POA: Diagnosis not present

## 2018-11-25 DIAGNOSIS — E559 Vitamin D deficiency, unspecified: Secondary | ICD-10-CM | POA: Diagnosis not present

## 2018-11-25 DIAGNOSIS — F1320 Sedative, hypnotic or anxiolytic dependence, uncomplicated: Secondary | ICD-10-CM | POA: Diagnosis not present

## 2018-11-25 DIAGNOSIS — Z0000 Encounter for general adult medical examination without abnormal findings: Secondary | ICD-10-CM | POA: Diagnosis not present

## 2018-11-25 DIAGNOSIS — E781 Pure hyperglyceridemia: Secondary | ICD-10-CM | POA: Diagnosis not present

## 2018-11-25 DIAGNOSIS — N182 Chronic kidney disease, stage 2 (mild): Secondary | ICD-10-CM | POA: Diagnosis not present

## 2018-11-25 DIAGNOSIS — E039 Hypothyroidism, unspecified: Secondary | ICD-10-CM | POA: Diagnosis not present

## 2019-03-22 DIAGNOSIS — Z23 Encounter for immunization: Secondary | ICD-10-CM | POA: Diagnosis not present

## 2019-05-14 DIAGNOSIS — H353131 Nonexudative age-related macular degeneration, bilateral, early dry stage: Secondary | ICD-10-CM | POA: Diagnosis not present

## 2019-05-14 DIAGNOSIS — H2513 Age-related nuclear cataract, bilateral: Secondary | ICD-10-CM | POA: Diagnosis not present

## 2019-05-14 DIAGNOSIS — H04123 Dry eye syndrome of bilateral lacrimal glands: Secondary | ICD-10-CM | POA: Diagnosis not present

## 2019-05-14 DIAGNOSIS — H43813 Vitreous degeneration, bilateral: Secondary | ICD-10-CM | POA: Diagnosis not present

## 2019-05-20 DIAGNOSIS — Z803 Family history of malignant neoplasm of breast: Secondary | ICD-10-CM | POA: Diagnosis not present

## 2019-05-20 DIAGNOSIS — Z1231 Encounter for screening mammogram for malignant neoplasm of breast: Secondary | ICD-10-CM | POA: Diagnosis not present

## 2019-05-27 DIAGNOSIS — L2089 Other atopic dermatitis: Secondary | ICD-10-CM | POA: Diagnosis not present

## 2019-05-27 DIAGNOSIS — L309 Dermatitis, unspecified: Secondary | ICD-10-CM | POA: Diagnosis not present

## 2019-06-02 DIAGNOSIS — M1990 Unspecified osteoarthritis, unspecified site: Secondary | ICD-10-CM | POA: Diagnosis not present

## 2019-06-02 DIAGNOSIS — E559 Vitamin D deficiency, unspecified: Secondary | ICD-10-CM | POA: Diagnosis not present

## 2019-06-02 DIAGNOSIS — F1320 Sedative, hypnotic or anxiolytic dependence, uncomplicated: Secondary | ICD-10-CM | POA: Diagnosis not present

## 2019-06-02 DIAGNOSIS — E039 Hypothyroidism, unspecified: Secondary | ICD-10-CM | POA: Diagnosis not present

## 2019-06-02 DIAGNOSIS — M8580 Other specified disorders of bone density and structure, unspecified site: Secondary | ICD-10-CM | POA: Diagnosis not present

## 2019-06-02 DIAGNOSIS — I129 Hypertensive chronic kidney disease with stage 1 through stage 4 chronic kidney disease, or unspecified chronic kidney disease: Secondary | ICD-10-CM | POA: Diagnosis not present

## 2019-06-02 DIAGNOSIS — F419 Anxiety disorder, unspecified: Secondary | ICD-10-CM | POA: Diagnosis not present

## 2019-06-02 DIAGNOSIS — K130 Diseases of lips: Secondary | ICD-10-CM | POA: Diagnosis not present

## 2019-06-02 DIAGNOSIS — N182 Chronic kidney disease, stage 2 (mild): Secondary | ICD-10-CM | POA: Diagnosis not present

## 2019-06-10 DIAGNOSIS — E038 Other specified hypothyroidism: Secondary | ICD-10-CM | POA: Diagnosis not present

## 2019-06-23 DIAGNOSIS — K130 Diseases of lips: Secondary | ICD-10-CM | POA: Diagnosis not present

## 2019-06-23 DIAGNOSIS — L821 Other seborrheic keratosis: Secondary | ICD-10-CM | POA: Diagnosis not present

## 2019-07-27 ENCOUNTER — Ambulatory Visit: Payer: Medicare PPO | Attending: Internal Medicine

## 2019-07-27 DIAGNOSIS — Z23 Encounter for immunization: Secondary | ICD-10-CM | POA: Insufficient documentation

## 2019-07-27 NOTE — Progress Notes (Signed)
   Covid-19 Vaccination Clinic  Name:  ROSEANNE JUENGER    MRN: [id number] DOB: 05-May-1941  07/27/2019  Ms. Moomaw was observed post Covid-19 immunization for 15 minutes without incidence. She was provided with Vaccine Information Sheet and instruction to access the V-Safe system.   Ms. Kimbrough was instructed to call 911 with any severe reactions post vaccine: Marland Kitchen Difficulty breathing  . Swelling of your face and throat  . A fast heartbeat  . A bad rash all over your body  . Dizziness and weakness   11:07

## 2019-08-17 ENCOUNTER — Ambulatory Visit: Payer: Medicare PPO | Attending: Internal Medicine

## 2019-08-17 DIAGNOSIS — Z23 Encounter for immunization: Secondary | ICD-10-CM | POA: Insufficient documentation

## 2019-08-17 NOTE — Progress Notes (Signed)
   Covid-19 Vaccination Clinic  Name:  PEIGHTYN ROBERSON    MRN: [id number] DOB: [birth year]  08/17/2019  Ms. Coombs was observed post Covid-19 immunization for 15 minutes without incidence. She was provided with Vaccine Information Sheet and instruction to access the V-Safe system.   Ms. Bencivenga was instructed to call 911 with any severe reactions post vaccine: Marland Kitchen Difficulty breathing  . Swelling of your face and throat  . A fast heartbeat  . A bad rash all over your body  . Dizziness and weakness    Immunizations Administered    Name Date Dose VIS Date Route   Pfizer COVID-19 Vaccine 08/17/2019 10:28 AM 0.3 mL 06/20/2019 Intramuscular   Manufacturer: [employer], [employer]   Lot: [id number]   NDC: [address]-6

## 2019-12-22 DIAGNOSIS — Z0000 Encounter for general adult medical examination without abnormal findings: Secondary | ICD-10-CM | POA: Diagnosis not present

## 2019-12-22 DIAGNOSIS — E038 Other specified hypothyroidism: Secondary | ICD-10-CM | POA: Diagnosis not present

## 2019-12-22 DIAGNOSIS — E781 Pure hyperglyceridemia: Secondary | ICD-10-CM | POA: Diagnosis not present

## 2019-12-22 DIAGNOSIS — E559 Vitamin D deficiency, unspecified: Secondary | ICD-10-CM | POA: Diagnosis not present

## 2019-12-29 DIAGNOSIS — N182 Chronic kidney disease, stage 2 (mild): Secondary | ICD-10-CM | POA: Diagnosis not present

## 2019-12-29 DIAGNOSIS — F419 Anxiety disorder, unspecified: Secondary | ICD-10-CM | POA: Diagnosis not present

## 2019-12-29 DIAGNOSIS — I129 Hypertensive chronic kidney disease with stage 1 through stage 4 chronic kidney disease, or unspecified chronic kidney disease: Secondary | ICD-10-CM | POA: Diagnosis not present

## 2019-12-29 DIAGNOSIS — R82998 Other abnormal findings in urine: Secondary | ICD-10-CM | POA: Diagnosis not present

## 2019-12-29 DIAGNOSIS — G3184 Mild cognitive impairment, so stated: Secondary | ICD-10-CM | POA: Diagnosis not present

## 2019-12-29 DIAGNOSIS — E559 Vitamin D deficiency, unspecified: Secondary | ICD-10-CM | POA: Diagnosis not present

## 2019-12-29 DIAGNOSIS — E038 Other specified hypothyroidism: Secondary | ICD-10-CM | POA: Diagnosis not present

## 2019-12-29 DIAGNOSIS — R809 Proteinuria, unspecified: Secondary | ICD-10-CM | POA: Diagnosis not present

## 2019-12-29 DIAGNOSIS — Z0000 Encounter for general adult medical examination without abnormal findings: Secondary | ICD-10-CM | POA: Diagnosis not present

## 2019-12-29 DIAGNOSIS — F1320 Sedative, hypnotic or anxiolytic dependence, uncomplicated: Secondary | ICD-10-CM | POA: Diagnosis not present

## 2020-04-10 DIAGNOSIS — Z23 Encounter for immunization: Secondary | ICD-10-CM | POA: Diagnosis not present

## 2020-05-18 DIAGNOSIS — H353131 Nonexudative age-related macular degeneration, bilateral, early dry stage: Secondary | ICD-10-CM | POA: Diagnosis not present

## 2020-05-18 DIAGNOSIS — H43813 Vitreous degeneration, bilateral: Secondary | ICD-10-CM | POA: Diagnosis not present

## 2020-05-18 DIAGNOSIS — H2513 Age-related nuclear cataract, bilateral: Secondary | ICD-10-CM | POA: Diagnosis not present

## 2020-05-18 DIAGNOSIS — H04123 Dry eye syndrome of bilateral lacrimal glands: Secondary | ICD-10-CM | POA: Diagnosis not present

## 2020-05-25 DIAGNOSIS — Z1231 Encounter for screening mammogram for malignant neoplasm of breast: Secondary | ICD-10-CM | POA: Diagnosis not present

## 2020-05-25 DIAGNOSIS — Z803 Family history of malignant neoplasm of breast: Secondary | ICD-10-CM | POA: Diagnosis not present

## 2020-07-01 DIAGNOSIS — R809 Proteinuria, unspecified: Secondary | ICD-10-CM | POA: Diagnosis not present

## 2020-07-01 DIAGNOSIS — I129 Hypertensive chronic kidney disease with stage 1 through stage 4 chronic kidney disease, or unspecified chronic kidney disease: Secondary | ICD-10-CM | POA: Diagnosis not present

## 2020-07-01 DIAGNOSIS — G3184 Mild cognitive impairment, so stated: Secondary | ICD-10-CM | POA: Diagnosis not present

## 2020-07-01 DIAGNOSIS — E039 Hypothyroidism, unspecified: Secondary | ICD-10-CM | POA: Diagnosis not present

## 2020-07-01 DIAGNOSIS — N182 Chronic kidney disease, stage 2 (mild): Secondary | ICD-10-CM | POA: Diagnosis not present

## 2020-07-01 DIAGNOSIS — F1320 Sedative, hypnotic or anxiolytic dependence, uncomplicated: Secondary | ICD-10-CM | POA: Diagnosis not present

## 2020-07-01 DIAGNOSIS — E559 Vitamin D deficiency, unspecified: Secondary | ICD-10-CM | POA: Diagnosis not present

## 2020-07-01 DIAGNOSIS — I493 Ventricular premature depolarization: Secondary | ICD-10-CM | POA: Diagnosis not present

## 2020-07-01 DIAGNOSIS — E781 Pure hyperglyceridemia: Secondary | ICD-10-CM | POA: Diagnosis not present

## 2020-09-23 DIAGNOSIS — Z79899 Other long term (current) drug therapy: Secondary | ICD-10-CM | POA: Diagnosis not present

## 2020-09-23 DIAGNOSIS — M8589 Other specified disorders of bone density and structure, multiple sites: Secondary | ICD-10-CM | POA: Diagnosis not present

## 2020-12-30 DIAGNOSIS — E781 Pure hyperglyceridemia: Secondary | ICD-10-CM | POA: Diagnosis not present

## 2020-12-30 DIAGNOSIS — E559 Vitamin D deficiency, unspecified: Secondary | ICD-10-CM | POA: Diagnosis not present

## 2020-12-30 DIAGNOSIS — E039 Hypothyroidism, unspecified: Secondary | ICD-10-CM | POA: Diagnosis not present

## 2020-12-30 DIAGNOSIS — N182 Chronic kidney disease, stage 2 (mild): Secondary | ICD-10-CM | POA: Diagnosis not present

## 2021-01-06 DIAGNOSIS — N182 Chronic kidney disease, stage 2 (mild): Secondary | ICD-10-CM | POA: Diagnosis not present

## 2021-01-06 DIAGNOSIS — Z0000 Encounter for general adult medical examination without abnormal findings: Secondary | ICD-10-CM | POA: Diagnosis not present

## 2021-01-06 DIAGNOSIS — Z1331 Encounter for screening for depression: Secondary | ICD-10-CM | POA: Diagnosis not present

## 2021-01-06 DIAGNOSIS — I493 Ventricular premature depolarization: Secondary | ICD-10-CM | POA: Diagnosis not present

## 2021-01-06 DIAGNOSIS — R82998 Other abnormal findings in urine: Secondary | ICD-10-CM | POA: Diagnosis not present

## 2021-01-06 DIAGNOSIS — F419 Anxiety disorder, unspecified: Secondary | ICD-10-CM | POA: Diagnosis not present

## 2021-01-06 DIAGNOSIS — M8580 Other specified disorders of bone density and structure, unspecified site: Secondary | ICD-10-CM | POA: Diagnosis not present

## 2021-01-06 DIAGNOSIS — E039 Hypothyroidism, unspecified: Secondary | ICD-10-CM | POA: Diagnosis not present

## 2021-01-06 DIAGNOSIS — Z1389 Encounter for screening for other disorder: Secondary | ICD-10-CM | POA: Diagnosis not present

## 2021-01-06 DIAGNOSIS — F1320 Sedative, hypnotic or anxiolytic dependence, uncomplicated: Secondary | ICD-10-CM | POA: Diagnosis not present

## 2021-01-06 DIAGNOSIS — R636 Underweight: Secondary | ICD-10-CM | POA: Diagnosis not present

## 2021-01-06 DIAGNOSIS — I129 Hypertensive chronic kidney disease with stage 1 through stage 4 chronic kidney disease, or unspecified chronic kidney disease: Secondary | ICD-10-CM | POA: Diagnosis not present

## 2021-04-16 DIAGNOSIS — Z23 Encounter for immunization: Secondary | ICD-10-CM | POA: Diagnosis not present

## 2021-05-20 DIAGNOSIS — H5203 Hypermetropia, bilateral: Secondary | ICD-10-CM | POA: Diagnosis not present

## 2021-05-20 DIAGNOSIS — H04123 Dry eye syndrome of bilateral lacrimal glands: Secondary | ICD-10-CM | POA: Diagnosis not present

## 2021-05-20 DIAGNOSIS — H524 Presbyopia: Secondary | ICD-10-CM | POA: Diagnosis not present

## 2021-05-20 DIAGNOSIS — H2513 Age-related nuclear cataract, bilateral: Secondary | ICD-10-CM | POA: Diagnosis not present

## 2021-05-20 DIAGNOSIS — H353131 Nonexudative age-related macular degeneration, bilateral, early dry stage: Secondary | ICD-10-CM | POA: Diagnosis not present

## 2021-06-10 DIAGNOSIS — Z1231 Encounter for screening mammogram for malignant neoplasm of breast: Secondary | ICD-10-CM | POA: Diagnosis not present

## 2021-07-21 DIAGNOSIS — N182 Chronic kidney disease, stage 2 (mild): Secondary | ICD-10-CM | POA: Diagnosis not present

## 2021-07-21 DIAGNOSIS — E559 Vitamin D deficiency, unspecified: Secondary | ICD-10-CM | POA: Diagnosis not present

## 2021-07-21 DIAGNOSIS — F1320 Sedative, hypnotic or anxiolytic dependence, uncomplicated: Secondary | ICD-10-CM | POA: Diagnosis not present

## 2021-07-21 DIAGNOSIS — M8580 Other specified disorders of bone density and structure, unspecified site: Secondary | ICD-10-CM | POA: Diagnosis not present

## 2021-07-21 DIAGNOSIS — R634 Abnormal weight loss: Secondary | ICD-10-CM | POA: Diagnosis not present

## 2021-07-21 DIAGNOSIS — E781 Pure hyperglyceridemia: Secondary | ICD-10-CM | POA: Diagnosis not present

## 2021-07-21 DIAGNOSIS — E039 Hypothyroidism, unspecified: Secondary | ICD-10-CM | POA: Diagnosis not present

## 2021-07-21 DIAGNOSIS — G3184 Mild cognitive impairment, so stated: Secondary | ICD-10-CM | POA: Diagnosis not present

## 2021-07-21 DIAGNOSIS — I129 Hypertensive chronic kidney disease with stage 1 through stage 4 chronic kidney disease, or unspecified chronic kidney disease: Secondary | ICD-10-CM | POA: Diagnosis not present

## 2021-11-23 ENCOUNTER — Ambulatory Visit (INDEPENDENT_AMBULATORY_CARE_PROVIDER_SITE_OTHER): Payer: Medicare PPO

## 2021-11-23 ENCOUNTER — Ambulatory Visit: Payer: Medicare PPO | Admitting: Orthopaedic Surgery

## 2021-11-23 DIAGNOSIS — M25561 Pain in right knee: Secondary | ICD-10-CM

## 2021-11-23 DIAGNOSIS — G8929 Other chronic pain: Secondary | ICD-10-CM

## 2021-11-23 DIAGNOSIS — M1711 Unilateral primary osteoarthritis, right knee: Secondary | ICD-10-CM | POA: Diagnosis not present

## 2021-11-23 MED ORDER — METHYLPREDNISOLONE ACETATE 40 MG/ML IJ SUSP
40.0000 mg | INTRAMUSCULAR | Status: AC | PRN
  Administered 2021-11-23: 40 mg via INTRA_ARTICULAR

## 2021-11-23 MED ORDER — LIDOCAINE HCL 1 % IJ SOLN
3.0000 mL | INTRAMUSCULAR | Status: AC | PRN
  Administered 2021-11-23: 3 mL

## 2021-11-23 NOTE — Progress Notes (Signed)
? ?Office Visit Note ?  ?Patient: Jody Summers           ?Date of Birth: 1940-09-21           ?MRN: [id number] ?Visit Date: 11/23/2021 ?             ?Requested by: Creola Corn, MD ?[address] ?[place],  [place] [address] ?PCP: Creola Corn, MD ? ? ?Assessment & Plan: ?Visit Diagnoses:  ?1. Chronic pain of right knee   ?2. Unilateral primary osteoarthritis, right knee   ? ? ?Plan: I went over her x-rays with her in detail and showed her the extent of her arthritis of her right knee.  I agree with trying a steroid injection today per her request and she did tolerate it well.  She may be a candidate at some point for hyaluronic acid for the right knee.  I would like to see her back in 4 weeks to see how she is doing overall.  All questions and concerns were answered and addressed. ? ?Follow-Up Instructions: Return in about 4 weeks (around 12/21/2021).  ? ?Orders:  ?Orders Placed This Encounter  ?Procedures  ? Large Joint Inj  ? XR Knee 1-2 Views Right  ? ?No orders of the defined types were placed in this encounter. ? ? ? ? Procedures: ?Large Joint Inj: R knee on 11/23/2021 10:10 AM ?Indications: diagnostic evaluation and pain ?Details: 22 G 1.5 in needle, superolateral approach ? ?Arthrogram: No ? ?Medications: 3 mL lidocaine 1 %; 40 mg methylPREDNISolone acetate 40 MG/ML ?Outcome: tolerated well, no immediate complications ?Procedure, treatment alternatives, risks and benefits explained, specific risks discussed. Consent was given by the patient. Immediately prior to procedure a time out was called to verify the correct patient, procedure, equipment, support staff and site/side marked as required. Patient was prepped and draped in the usual sterile fashion.  ? ? ? ? ?Clinical Data: ?No additional findings. ? ? ?Subjective: ?Chief Complaint  ?Patient presents with  ? Right Knee - Pain  ?The patient is a 81 year old female who has been having right knee pain for many years now.  She had a steroid injection in that right  knee years ago.  She feels like she may have overdone it in terms of her exercise routine and working in the yard.  She denies any swelling.  She points the medial aspect of the source of her pain with her right knee.  She is not a diabetic.  She denies any acute change in her medical status.  She tries to stay active. ? ?HPI ? ?Review of Systems ?There is no listed fever, chills, nausea, vomiting ? ?Objective: ?Vital Signs: There were no vitals taken for this visit. ? ?Physical Exam ?She is alert and orient x3 and in no acute distress ?Ortho Exam ?Examination of her right knee shows significant varus malalignment.  There is medial joint line tenderness but no effusion.  She has good range of motion of the right knee. ?Specialty Comments:  ?No specialty comments available. ? ?Imaging: ?XR Knee 1-2 Views Right ? ?Result Date: 11/23/2021 ?3 views of the right knee show varus malalignment with bone-on-bone wear and complete loss of the joint space on the medial aspect of the knee.  There is also patellofemoral arthritic changes.  There is varus malalignment.  ? ? ?PMFS History: ?Patient Active Problem List  ? Diagnosis Date Noted  ? Unilateral primary osteoarthritis, right knee 11/23/2021  ? Aspiration pneumonia (HCC) 08/23/2014  ? SOB (shortness of  breath)   ? Acute respiratory failure with hypoxia (HCC) 08/22/2014  ? Hypernatremia 08/22/2014  ? Hypokalemia 08/22/2014  ? SBO (small bowel obstruction) (HCC) 08/15/2014  ? Anxiety 08/15/2014  ? Depression 08/15/2014  ? Essential hypertension 08/15/2014  ? Thyroid disease 08/15/2014  ? Hormone replacement therapy 08/15/2014  ? ?Past Medical History:  ?Diagnosis Date  ? SBO (small bowel obstruction) (HCC) 08/15/2014  ? Thyroid disease   ?  ?No family history on file.  ?Past Surgical History:  ?Procedure Laterality Date  ? ABDOMINAL HYSTERECTOMY    ? CHOLECYSTECTOMY    ? LAPAROTOMY N/A 08/21/2014  ? Procedure: EXPLORATORY LAPAROTOMY;  Surgeon: Chevis Pretty III, MD;  Location: [facility]  [facility];  Service: General;  Laterality: N/A;  ? LYSIS OF ADHESION N/A 08/21/2014  ? Procedure: LYSIS OF ADHESION;  Surgeon: Chevis Pretty III, MD;  Location: [facility] ORS;  Service: General;  Laterality: N/A;  ? THYROID SURGERY    ? ?Social History  ? ?Occupational History  ? Not on file  ?Tobacco Use  ? Smoking status: Never  ? Smokeless tobacco: Not on file  ?Substance and Sexual Activity  ? Alcohol use: No  ? Drug use: Not on file  ? Sexual activity: Not on file  ? ? ? ? ? ? ?

## 2021-12-21 ENCOUNTER — Encounter: Payer: Self-pay | Admitting: Orthopaedic Surgery

## 2021-12-21 ENCOUNTER — Ambulatory Visit: Payer: Medicare PPO | Admitting: Orthopaedic Surgery

## 2021-12-21 ENCOUNTER — Telehealth: Payer: Self-pay

## 2021-12-21 DIAGNOSIS — M1711 Unilateral primary osteoarthritis, right knee: Secondary | ICD-10-CM

## 2021-12-21 DIAGNOSIS — G8929 Other chronic pain: Secondary | ICD-10-CM | POA: Diagnosis not present

## 2021-12-21 DIAGNOSIS — M25561 Pain in right knee: Secondary | ICD-10-CM | POA: Diagnosis not present

## 2021-12-21 NOTE — Telephone Encounter (Signed)
Noted  

## 2021-12-21 NOTE — Telephone Encounter (Signed)
Right knee gel injection  

## 2021-12-21 NOTE — Progress Notes (Signed)
The patient is an 81 year old female who comes in for follow-up as it relates to the severe arthritis of her right knee.  She does ride her exercise bike daily and tries to stay active.  However, her right knee does hurt on a daily basis.  Recent x-rays show tricompartment arthritis with loss of joint space and medial compartment of the knee with varus malalignment.  We did recently placed a steroid injection in her right knee 4 weeks ago and she said this did not really help her at all.  She did get about a week or so relief.  She is inquiring about hyaluronic acid as a next step given the failure of other conservative treatment modalities.  Examination of her right knee shows no effusion.  There is varus malalignment and bone-on-bone wear in the medial compartment and the patellofemoral joint.  I did go over her x-rays with her again of her right knee.  It is certainly reasonable to try hyaluronic acid for her right knee to treat the pain from osteoarthritis given the very conservative treatment including the failure of the steroid injection.  We will see about ordering this injection for her.

## 2021-12-27 DIAGNOSIS — L603 Nail dystrophy: Secondary | ICD-10-CM | POA: Diagnosis not present

## 2021-12-27 DIAGNOSIS — Z79899 Other long term (current) drug therapy: Secondary | ICD-10-CM | POA: Diagnosis not present

## 2022-01-03 NOTE — Telephone Encounter (Signed)
Talked with patient's husband and appointment has been made for gel injection.

## 2022-01-18 ENCOUNTER — Other Ambulatory Visit: Payer: Self-pay

## 2022-01-18 DIAGNOSIS — M1711 Unilateral primary osteoarthritis, right knee: Secondary | ICD-10-CM

## 2022-01-19 ENCOUNTER — Encounter: Payer: Self-pay | Admitting: Orthopaedic Surgery

## 2022-01-19 ENCOUNTER — Ambulatory Visit: Payer: Medicare PPO | Admitting: Orthopaedic Surgery

## 2022-01-19 DIAGNOSIS — M1711 Unilateral primary osteoarthritis, right knee: Secondary | ICD-10-CM | POA: Diagnosis not present

## 2022-01-19 MED ORDER — HYALURONAN 88 MG/4ML IX SOSY
88.0000 mg | PREFILLED_SYRINGE | INTRA_ARTICULAR | Status: AC | PRN
  Administered 2022-01-19: 88 mg via INTRA_ARTICULAR

## 2022-01-19 NOTE — Progress Notes (Signed)
   Procedure Note  Patient: Jody Summers             Date of Birth: 15-Dec-1940           MRN: [id number]             Visit Date: 01/19/2022  Procedures: Visit Diagnoses:  1. Unilateral primary osteoarthritis, right knee     Large Joint Inj: R knee on 01/19/2022 2:15 PM Indications: diagnostic evaluation and pain Details: 22 G 1.5 in needle, superolateral approach  Arthrogram: No  Medications: 88 mg Hyaluronan 88 MG/4ML Outcome: tolerated well, no immediate complications Procedure, treatment alternatives, risks and benefits explained, specific risks discussed. Consent was given by the patient. Immediately prior to procedure a time out was called to verify the correct patient, procedure, equipment, support staff and site/side marked as required. Patient was prepped and draped in the usual sterile fashion.    The patient comes in today for hyaluronic acid injection with Monovisc in her right knee to treat the pain from osteoarthritis.  She has tried and failed other forms of conservative treatment including steroid injections.  Her knee does have varus malalignment and significant medial joint line tenderness.  There is patellofemoral crepitation as well.  I did place Monovisc in the right knee without difficulty.  All question concerns were answered and addressed.  If this does not work the next step will be recommending a knee replacement and her husband understand this as well.  If he gets to that point she

## 2022-01-23 DIAGNOSIS — E781 Pure hyperglyceridemia: Secondary | ICD-10-CM | POA: Diagnosis not present

## 2022-01-23 DIAGNOSIS — F419 Anxiety disorder, unspecified: Secondary | ICD-10-CM | POA: Diagnosis not present

## 2022-01-23 DIAGNOSIS — E559 Vitamin D deficiency, unspecified: Secondary | ICD-10-CM | POA: Diagnosis not present

## 2022-01-23 DIAGNOSIS — E039 Hypothyroidism, unspecified: Secondary | ICD-10-CM | POA: Diagnosis not present

## 2022-01-23 DIAGNOSIS — Z79899 Other long term (current) drug therapy: Secondary | ICD-10-CM | POA: Diagnosis not present

## 2022-01-30 DIAGNOSIS — R809 Proteinuria, unspecified: Secondary | ICD-10-CM | POA: Diagnosis not present

## 2022-01-30 DIAGNOSIS — Z1389 Encounter for screening for other disorder: Secondary | ICD-10-CM | POA: Diagnosis not present

## 2022-01-30 DIAGNOSIS — M1990 Unspecified osteoarthritis, unspecified site: Secondary | ICD-10-CM | POA: Diagnosis not present

## 2022-01-30 DIAGNOSIS — I868 Varicose veins of other specified sites: Secondary | ICD-10-CM | POA: Diagnosis not present

## 2022-01-30 DIAGNOSIS — R636 Underweight: Secondary | ICD-10-CM | POA: Diagnosis not present

## 2022-01-30 DIAGNOSIS — I493 Ventricular premature depolarization: Secondary | ICD-10-CM | POA: Diagnosis not present

## 2022-01-30 DIAGNOSIS — Z1331 Encounter for screening for depression: Secondary | ICD-10-CM | POA: Diagnosis not present

## 2022-01-30 DIAGNOSIS — Z0000 Encounter for general adult medical examination without abnormal findings: Secondary | ICD-10-CM | POA: Diagnosis not present

## 2022-01-30 DIAGNOSIS — R82998 Other abnormal findings in urine: Secondary | ICD-10-CM | POA: Diagnosis not present

## 2022-01-30 DIAGNOSIS — F419 Anxiety disorder, unspecified: Secondary | ICD-10-CM | POA: Diagnosis not present

## 2022-01-30 DIAGNOSIS — E039 Hypothyroidism, unspecified: Secondary | ICD-10-CM | POA: Diagnosis not present

## 2022-01-30 DIAGNOSIS — K130 Diseases of lips: Secondary | ICD-10-CM | POA: Diagnosis not present

## 2022-04-22 DIAGNOSIS — Z23 Encounter for immunization: Secondary | ICD-10-CM | POA: Diagnosis not present

## 2022-05-23 DIAGNOSIS — H353131 Nonexudative age-related macular degeneration, bilateral, early dry stage: Secondary | ICD-10-CM | POA: Diagnosis not present

## 2022-05-23 DIAGNOSIS — H04123 Dry eye syndrome of bilateral lacrimal glands: Secondary | ICD-10-CM | POA: Diagnosis not present

## 2022-05-23 DIAGNOSIS — H43813 Vitreous degeneration, bilateral: Secondary | ICD-10-CM | POA: Diagnosis not present

## 2022-05-23 DIAGNOSIS — H2511 Age-related nuclear cataract, right eye: Secondary | ICD-10-CM | POA: Diagnosis not present

## 2022-05-29 ENCOUNTER — Encounter: Payer: Self-pay | Admitting: Orthopaedic Surgery

## 2022-05-29 ENCOUNTER — Ambulatory Visit: Payer: Medicare PPO | Admitting: Orthopaedic Surgery

## 2022-05-29 DIAGNOSIS — M25561 Pain in right knee: Secondary | ICD-10-CM | POA: Diagnosis not present

## 2022-05-29 DIAGNOSIS — M1711 Unilateral primary osteoarthritis, right knee: Secondary | ICD-10-CM

## 2022-05-29 DIAGNOSIS — G8929 Other chronic pain: Secondary | ICD-10-CM

## 2022-05-29 NOTE — Progress Notes (Signed)
The patient is well-known to me.  She is 81 years old and very active.  She has well-documented severe end-stage arthritis of her right knee including significant varus malalignment.  She has tried and failed all forms conservative treatment for over a year now as a relates to her severe osteoarthritis of that knee.  We have tried steroid injections and hyaluronic acid injections.  She is thin and active and does exercise regularly and even ride a exercise bike.  She is at the point where her pain is daily and it is 10 out of 10.  Her right knee pain is detrimentally affecting her mobility, her quality of life, and her actives daily living to the point she does wish to proceed with a knee replacement on her right side sometime after the first of the year.  I did review all of her records within epic as well as her medications.  Her right knee has significant varus malalignment.  There is a flexion contracture as well and limited motion of that knee.  She walks with significant limp as well.  Previous x-rays show severe end-stage arthritis of the right knee.  I did show her and her husband a knee replacement model.  I described in detail what the surgery involves.  We talked about the risk and benefits of surgery and what to expect from an intraoperative and postoperative course.  All questions and concerns were answered and addressed.  We will work on getting the surgery scheduled.

## 2022-06-19 DIAGNOSIS — Z1231 Encounter for screening mammogram for malignant neoplasm of breast: Secondary | ICD-10-CM | POA: Diagnosis not present

## 2022-07-13 ENCOUNTER — Other Ambulatory Visit: Payer: Self-pay

## 2022-07-17 ENCOUNTER — Other Ambulatory Visit: Payer: Self-pay | Admitting: Physician Assistant

## 2022-07-17 DIAGNOSIS — Z01818 Encounter for other preprocedural examination: Secondary | ICD-10-CM

## 2022-07-20 NOTE — Progress Notes (Addendum)
COVID Vaccine received:  _0  No _1  Yes Date of any COVID positive Test in last 90 days:  None  PCP -  Shon Baton, MD Cardiologist - None  Chest x-ray -  EKG - will do at PST  Stress Test -  ECHO -  Cardiac Cath -   PCR screen: _2  Ordered & Completed                      _3   No Order but Needs PROFEND                      _4   N/A for this surgery  Surgery Plan:  _5  Ambulatory                            _6  Outpatient in bed                            _7  Admit  Anesthesia:    _8  General  _9  Spinal                           _10   Choice _11   MAC  Pacemaker / ICD device _12  No _13  Yes        Device order form faxed _14  No    _15   Yes      Faxed to:  Spinal Cord Stimulator:_16  No _17  Yes      (Remind patient to bring remote DOS) Other Implants:   History of Sleep Apnea? _18  No _19  Yes   CPAP used?- _20  No _21  Yes    Does the patient monitor blood sugar? _22  No _23  Yes  _24  N/A  Blood Thinner / Instructions:none Aspirin Instructions:  ASA 81 mg  Patient aware to stop today, 07-21-22  ERAS Protocol Ordered: _25  No  _26  Yes PRE-SURGERY _27  ENSURE  _28  G2  Patient is to be NPO after: 08:15 am   Activity level: Patient can climb a flight of stairs without difficulty; _29  No CP  _30  No SOB, but would have leg pain. Patient can  perform ADLs without assistance.   Anesthesia review: HTN, anxiety, DOE  Patient denies shortness of breath, fever, cough and chest pain at PAT appointment.  Patient verbalized understanding and agreement to the Pre-Surgical Instructions that were given to them at this PAT appointment. Patient was also educated of the need to review these PAT instructions again prior to his/her surgery.I reviewed the appropriate phone numbers to call if they have any and questions or concerns.

## 2022-07-20 NOTE — Patient Instructions (Addendum)
SURGICAL WAITING ROOM VISITATION Patients having surgery or a procedure may have no more than 2 support people in the waiting area - these visitors may rotate in the visitor waiting room.   Due to an increase in RSV and influenza rates and associated hospitalizations, children ages 79 and under may not visit patients in [facility] hospitals. If the patient needs to stay at the hospital during part of their recovery, the visitor guidelines for inpatient rooms apply.  PRE-OP VISITATION  Pre-op nurse will coordinate an appropriate time for 1 support person to accompany the patient in pre-op.  This support person may not rotate.  This visitor will be contacted when the time is appropriate for the visitor to come back in the pre-op area.  Please refer to the [facility] website for the visitor guidelines for Inpatients (after your surgery is over and you are in a regular room).  You are not required to quarantine at this time prior to your surgery. However, you must do this: Hand Hygiene often Do NOT share personal items Notify your provider if you are in close contact with someone who has COVID or you develop fever 100.4 or greater, new onset of sneezing, cough, sore throat, shortness of breath or body aches.  If you test positive for Covid or have been in contact with anyone that has tested positive in the last 10 days please notify you surgeon.    Your procedure is scheduled on:  Friday  July 28, 2022  Report to [facility] Main Entrance: [place] entrance where the [employer] is available.   Report to admitting at: 08:45    AM  +++++Call this number if you have any questions or problems the morning of surgery [phone]  Do not eat food after Midnight the night prior to your surgery/procedure.  After Midnight you may have the following liquids until 08:15 AM  DAY OF SURGERY  Clear Liquid Diet Water Black Coffee (sugar ok, NO MILK/CREAM OR CREAMERS)  Tea (sugar ok, NO  MILK/CREAM OR CREAMERS) regular and decaf                             Plain Jell-O  with no fruit (NO RED)                                           Fruit ices (not with fruit pulp, NO RED)                                     Popsicles (NO RED)                                                                  Juice: apple, WHITE grape, WHITE cranberry Sports drinks like Gatorade or Powerade (NO RED)                    The day of surgery:  Drink ONE (1) Pre-Surgery Clear Ensure at 08:15 AM the morning of surgery. Drink in one sitting. Do  not sip.  This drink was given to you during your hospital pre-op appointment visit. Nothing else to drink after completing the Pre-Surgery Clear Ensure  : No candy, chewing gum or throat lozenges.    FOLLOW ANY ADDITIONAL PRE OP INSTRUCTIONS YOU RECEIVED FROM YOUR SURGEON'S OFFICE!!!   Oral Hygiene is also important to reduce your risk of infection.        Remember - BRUSH YOUR TEETH THE MORNING OF SURGERY WITH YOUR REGULAR TOOTHPASTE   Take ONLY these medicines the morning of surgery with A SIP OF WATER: Labetalol (Normodyne), levothyroxine (Synthroid)                   You may not have any metal on your body including hair pins, jewelry, and body piercing  Do not wear make-up, lotions, powders, perfumes or deodorant  Do not wear nail polish including gel and S&S, artificial / acrylic nails, or any other type of covering on natural nails including finger and toenails. If you have artificial nails, gel coating, etc., that needs to be removed by a nail salon, Please have this removed prior to surgery. Not doing so may mean that your surgery could be cancelled or delayed if the Surgeon or anesthesia staff feels like they are unable to monitor you safely.   Do not shave 48 hours prior to surgery to avoid nicks in your skin which may contribute to postoperative infections.   You may bring a small overnight bag with you on the day of surgery, only pack  items that are not valuable. [facility] IS NOT RESPONSIBLE   FOR VALUABLES THAT ARE LOST OR STOLEN.   Do not bring your home medications to the hospital. The Pharmacy will dispense medications listed on your medication list to you during your admission in the Hospital.  Special Instructions: Bring a copy of your healthcare power of attorney and living will documents the day of surgery, if you wish to have them scanned into your [facility] Records- EPIC  Please read over the following fact sheets you were given: IF YOU HAVE QUESTIONS ABOUT YOUR PRE-OP INSTRUCTIONS, PLEASE CALL [phone]  (KAY)   [facility] - Preparing for Surgery Before surgery, you can play an important role.  Because skin is not sterile, your skin needs to be as free of germs as possible.  You can reduce the number of germs on your skin by washing with CHG (chlorahexidine gluconate) soap before surgery.  CHG is an antiseptic cleaner which kills germs and bonds with the skin to continue killing germs even after washing. Please DO NOT use if you have an allergy to CHG or antibacterial soaps.  If your skin becomes reddened/irritated stop using the CHG and inform your nurse when you arrive at Short Stay. Do not shave (including legs and underarms) for at least 48 hours prior to the first CHG shower.  You may shave your face/neck.  Please follow these instructions carefully:  1.  Shower with CHG Soap the night before surgery and the  morning of surgery.  2.  If you choose to wash your hair, wash your hair first as usual with your normal  shampoo.  3.  After you shampoo, rinse your hair and body thoroughly to remove the shampoo.                             4.  Use CHG as you would any other liquid  soap.  You can apply chg directly to the skin and wash.  Gently with a scrungie or clean washcloth.  5.  Apply the CHG Soap to your body ONLY FROM THE NECK DOWN.   Do not use on face/ open                           Wound or open  sores. Avoid contact with eyes, ears mouth and genitals (private parts).                       Wash face,  Genitals (private parts) with your normal soap.             6.  Wash thoroughly, paying special attention to the area where your  surgery  will be performed.  7.  Thoroughly rinse your body with warm water from the neck down.  8.  DO NOT shower/wash with your normal soap after using and rinsing off the CHG Soap.            9.  Pat yourself dry with a clean towel.            10.  Wear clean pajamas.            11.  Place clean sheets on your bed the night of your first shower and do not  sleep with pets.  ON THE DAY OF SURGERY : Do not apply any lotions/deodorants the morning of surgery.  Please wear clean clothes to the hospital/surgery center.    FAILURE TO FOLLOW THESE INSTRUCTIONS MAY RESULT IN THE CANCELLATION OF YOUR SURGERY  PATIENT SIGNATURE_________________________________  NURSE SIGNATURE__________________________________  ________________________________________________________________________       Adam Phenix    An incentive spirometer is a tool that can help keep your lungs clear and active. This tool measures how well you are filling your lungs with each breath. Taking long deep breaths may help reverse or decrease the chance of developing breathing (pulmonary) problems (especially infection) following: A long period of time when you are unable to move or be active. BEFORE THE PROCEDURE  If the spirometer includes an indicator to show your best effort, your nurse or respiratory therapist will set it to a desired goal. If possible, sit up straight or lean slightly forward. Try not to slouch. Hold the incentive spirometer in an upright position. INSTRUCTIONS FOR USE  Sit on the edge of your bed if possible, or sit up as far as you can in bed or on a chair. Hold the incentive spirometer in an upright position. Breathe out normally. Place the mouthpiece  in your mouth and seal your lips tightly around it. Breathe in slowly and as deeply as possible, raising the piston or the ball toward the top of the column. Hold your breath for 3-5 seconds or for as long as possible. Allow the piston or ball to fall to the bottom of the column. Remove the mouthpiece from your mouth and breathe out normally. Rest for a few seconds and repeat Steps 1 through 7 at least 10 times every 1-2 hours when you are awake. Take your time and take a few normal breaths between deep breaths. The spirometer may include an indicator to show your best effort. Use the indicator as a goal to work toward during each repetition. After each set of 10 deep breaths, practice coughing to be sure your lungs are clear. If you have an incision (  the cut made at the time of surgery), support your incision when coughing by placing a pillow or rolled up towels firmly against it. Once you are able to get out of bed, walk around indoors and cough well. You may stop using the incentive spirometer when instructed by your caregiver.  RISKS AND COMPLICATIONS Take your time so you do not get dizzy or light-headed. If you are in pain, you may need to take or ask for pain medication before doing incentive spirometry. It is harder to take a deep breath if you are having pain. AFTER USE Rest and breathe slowly and easily. It can be helpful to keep track of a log of your progress. Your caregiver can provide you with a simple table to help with this. If you are using the spirometer at home, follow these instructions: [place] IF:  You are having difficultly using the spirometer. You have trouble using the spirometer as often as instructed. Your pain medication is not giving enough relief while using the spirometer. You develop fever of 100.5 F (38.1 C) or higher.                                                                                                    SEEK IMMEDIATE MEDICAL CARE IF:   You cough up bloody sputum that had not been present before. You develop fever of 102 F (38.9 C) or greater. You develop worsening pain at or near the incision site. MAKE SURE YOU:  Understand these instructions. Will watch your condition. Will get help right away if you are not doing well or get worse. Document Released: 11/06/2006 Document Revised: 09/18/2011 Document Reviewed: 01/07/2007 [facility] Patient Information 2014 [place], [place].       WHAT IS A BLOOD TRANSFUSION? Blood Transfusion Information  A transfusion is the replacement of blood or some of its parts. Blood is made up of multiple cells which provide different functions. Red blood cells carry oxygen and are used for blood loss replacement. White blood cells fight against infection. Platelets control bleeding. Plasma helps clot blood. Other blood products are available for specialized needs, such as hemophilia or other clotting disorders. BEFORE THE TRANSFUSION  Who gives blood for transfusions?  Healthy volunteers who are fully evaluated to make sure their blood is safe. This is blood bank blood. Transfusion therapy is the safest it has ever been in the practice of medicine. Before blood is taken from a donor, a complete history is taken to make sure that person has no history of diseases nor engages in risky social behavior (examples are intravenous drug use or sexual activity with multiple partners). The donor's travel history is screened to minimize risk of transmitting infections, such as malaria. The donated blood is tested for signs of infectious diseases, such as HIV and hepatitis. The blood is then tested to be sure it is compatible with you in order to minimize the chance of a transfusion reaction. If you or a relative donates blood, this is often done in anticipation of surgery and is not appropriate for emergency situations. It takes  many days to process the donated blood. RISKS AND COMPLICATIONS Although  transfusion therapy is very safe and saves many lives, the main dangers of transfusion include:  Getting an infectious disease. Developing a transfusion reaction. This is an allergic reaction to something in the blood you were given. Every precaution is taken to prevent this. The decision to have a blood transfusion has been considered carefully by your caregiver before blood is given. Blood is not given unless the benefits outweigh the risks. AFTER THE TRANSFUSION Right after receiving a blood transfusion, you will usually feel much better and more energetic. This is especially true if your red blood cells have gotten low (anemic). The transfusion raises the level of the red blood cells which carry oxygen, and this usually causes an energy increase. The nurse administering the transfusion will monitor you carefully for complications. HOME CARE INSTRUCTIONS  No special instructions are needed after a transfusion. You may find your energy is better. Speak with your caregiver about any limitations on activity for underlying diseases you may have. SEEK MEDICAL CARE IF:  Your condition is not improving after your transfusion. You develop redness or irritation at the intravenous (IV) site. SEEK IMMEDIATE MEDICAL CARE IF:  Any of the following symptoms occur over the next 12 hours: Shaking chills. You have a temperature by mouth above 102 F (38.9 C), not controlled by medicine. Chest, back, or muscle pain. People around you feel you are not acting correctly or are confused. Shortness of breath or difficulty breathing. Dizziness and fainting. You get a rash or develop hives. You have a decrease in urine output. Your urine turns a dark color or changes to pink, red, or brown. Any of the following symptoms occur over the next 10 days: You have a temperature by mouth above 102 F (38.9 C), not controlled by medicine. Shortness of breath. Weakness after normal activity. The white part of the eye  turns yellow (jaundice). You have a decrease in the amount of urine or are urinating less often. Your urine turns a dark color or changes to pink, red, or brown. Document Released: 06/23/2000 Document Revised: 09/18/2011 Document Reviewed: 02/10/2008 [facility] Patient Information 2014 [place], [place].  _______________________________________________________________________

## 2022-07-21 ENCOUNTER — Other Ambulatory Visit: Payer: Self-pay

## 2022-07-21 ENCOUNTER — Encounter (HOSPITAL_COMMUNITY): Payer: Self-pay

## 2022-07-21 ENCOUNTER — Encounter (HOSPITAL_COMMUNITY)
Admission: RE | Admit: 2022-07-21 | Discharge: 2022-07-21 | Disposition: A | Discharge status: Home / Self Care | Payer: Medicare PPO | Source: Ambulatory Visit | Attending: Orthopaedic Surgery | Admitting: Orthopaedic Surgery

## 2022-07-21 VITALS — BP 118/61 | HR 66 | Temp 97.9°F | Ht 62.0 in | Wt 114.2 lb

## 2022-07-21 DIAGNOSIS — Z01818 Encounter for other preprocedural examination: Secondary | ICD-10-CM

## 2022-07-21 DIAGNOSIS — I10 Essential (primary) hypertension: Secondary | ICD-10-CM

## 2022-07-21 HISTORY — DX: Essential (primary) hypertension: I10

## 2022-07-21 HISTORY — DX: Pneumonia, unspecified organism: J18.9

## 2022-07-21 HISTORY — DX: Thyrotoxicosis, unspecified without thyrotoxic crisis or storm: E05.90

## 2022-07-21 HISTORY — DX: Anxiety disorder, unspecified: F41.9

## 2022-07-21 HISTORY — DX: Dyspnea, unspecified: R06.00

## 2022-07-21 HISTORY — DX: Unspecified osteoarthritis, unspecified site: M19.90

## 2022-07-21 LAB — COMPREHENSIVE METABOLIC PANEL
ALT: 25 U/L (ref 0–44)
AST: 28 U/L (ref 15–41)
Albumin: 4.1 g/dL (ref 3.5–5.0)
Alkaline Phosphatase: 38 U/L (ref 38–126)
Anion gap: 7 (ref 5–15)
BUN: 19 mg/dL (ref 8–23)
CO2: 25 mmol/L (ref 22–32)
Calcium: 9.4 mg/dL (ref 8.9–10.3)
Chloride: 107 mmol/L (ref 98–111)
Creatinine, Ser: 0.66 mg/dL (ref 0.44–1.00)
GFR, Estimated: >60 mL/min (ref >60)
Glucose, Bld: 98 mg/dL (ref 70–99)
Potassium: 4.2 mmol/L (ref 3.5–5.1)
Sodium: 139 mmol/L (ref 135–145)
Total Bilirubin: 0.5 mg/dL (ref 0.3–1.2)
Total Protein: 7.2 g/dL (ref 6.5–8.1)

## 2022-07-21 LAB — CBC
HCT: 41.1 % (ref 36.0–46.0)
Hemoglobin: 13.4 g/dL (ref 12.0–15.0)
MCH: 29.2 pg (ref 26.0–34.0)
MCHC: 32.6 g/dL (ref 30.0–36.0)
MCV: 89.5 fL (ref 80.0–100.0)
Platelets: 194 10*3/uL (ref 150–400)
RBC: 4.59 MIL/uL (ref 3.87–5.11)
RDW: 12.5 % (ref 11.5–15.5)
WBC: 5.1 10*3/uL (ref 4.0–10.5)
nRBC: 0 % (ref 0.0–0.2)

## 2022-07-21 LAB — SURGICAL PCR SCREEN
MRSA, PCR: NEGATIVE
Staphylococcus aureus: NEGATIVE

## 2022-07-26 ENCOUNTER — Telehealth: Payer: Self-pay | Admitting: Orthopaedic Surgery

## 2022-07-26 NOTE — Telephone Encounter (Signed)
Patient's husband [name] asked if patient can go to [facility]    [place], [place]    Phone# [place] asked if he can get a call letting him know everything is alright.   The number to contact [name] is [phone]

## 2022-07-27 ENCOUNTER — Telehealth: Payer: Self-pay | Admitting: *Deleted

## 2022-07-27 NOTE — Care Plan (Signed)
[facility] RNCM call to patient and her husband to discuss her upcoming Right total knee arthroplasty with Dr. Ninfa Linden on 07/28/22. She is an Ortho bundle patient through [facility] and is agreeable to case management. She lives in [place] with her husband, who will be assisting. [facility] Provides in home therapy services through [facility] and will be providing her therapy. Reviewed all post op care instructions. Patient has a RW and Handicap equipped bathrooms. Will continue to follow for needs.

## 2022-07-27 NOTE — H&P (Signed)
TOTAL KNEE ADMISSION H&P  Patient is being admitted for right total knee arthroplasty.  Subjective:  Chief Complaint:right knee pain.  HPI: Jody Summers, 82 y.o. female, has a history of pain and functional disability in the right knee due to arthritis and has failed non-surgical conservative treatments for greater than 12 weeks to includeNSAID's and/or analgesics, corticosteriod injections, viscosupplementation injections, flexibility and strengthening excercises, use of assistive devices, and activity modification.  Onset of symptoms was gradual, starting 5 years ago with gradually worsening course since that time. The patient noted no past surgery on the right knee(s).  Patient currently rates pain in the right knee(s) at 10 out of 10 with activity. Patient has night pain, worsening of pain with activity and weight bearing, pain that interferes with activities of daily living, pain with passive range of motion, crepitus, and joint swelling.  Patient has evidence of subchondral sclerosis, periarticular osteophytes, and joint space narrowing by imaging studies. There is no active infection.  Patient Active Problem List   Diagnosis Date Noted   Unilateral primary osteoarthritis, right knee 11/23/2021   Aspiration pneumonia ([place]) 08/23/2014   SOB (shortness of breath)    Acute respiratory failure with hypoxia ([facility]) 08/22/2014   Hypernatremia 08/22/2014   Hypokalemia 08/22/2014   SBO (small bowel obstruction) ([place]) 08/15/2014   Anxiety 08/15/2014   Depression 08/15/2014   Essential hypertension 08/15/2014   Thyroid disease 08/15/2014   Hormone replacement therapy 08/15/2014   Past Medical History:  Diagnosis Date   Anxiety    Arthritis    Dyspnea    Hypertension    Hyperthyroidism    Pneumonia    aspiration pneumonia   SBO (small bowel obstruction) ([place]) 08/15/2014   Thyroid disease     Past Surgical History:  Procedure Laterality Date   ABDOMINAL HYSTERECTOMY  2000   Dr.  Ardine Eng   BREAST SURGERY Left 1992   Excisional biopsy   CHOLECYSTECTOMY  1980   open cholecystectomy by Dr. Marylene Buerger   LAPAROTOMY N/A 08/21/2014   Procedure: EXPLORATORY LAPAROTOMY;  Surgeon: Autumn Messing III, MD;  Location: [facility] ORS;  Service: General;  Laterality: N/A;   LYSIS OF ADHESION N/A 08/21/2014   Procedure: LYSIS OF ADHESION;  Surgeon: Autumn Messing III, MD;  Location: [facility] ORS;  Service: General;  Laterality: N/A;   THYROID SURGERY  1991   Partial Thyroidectomy by Dr. Marylene Buerger    No current facility-administered medications for this encounter.   Current Outpatient Medications  Medication Sig Dispense Refill Last Dose   acetaminophen (TYLENOL) 500 MG tablet Take 500 mg by mouth every 6 (six) hours as needed for moderate pain.      aspirin 81 MG chewable tablet Chew 81 mg by mouth daily.      cholecalciferol (VITAMIN D3) 25 MCG (1000 UNIT) tablet Take 1,000 Units by mouth daily.      labetalol (NORMODYNE) 100 MG tablet Take 50 mg by mouth 2 (two) times daily.      levothyroxine (SYNTHROID, LEVOTHROID) 25 MCG tablet Take 25 mcg by mouth See admin instructions. Take 25 mcg daily on Sun, Mon, Wed, Thurs, and Fri, skip does on Tues and Sat      LORazepam (ATIVAN) 0.5 MG tablet Take 0.5 mg by mouth at bedtime.      mirtazapine (REMERON) 15 MG tablet Take 15 mg by mouth at bedtime.      Multiple Vitamins-Minerals (PRESERVISION AREDS 2) CAPS Take 1 capsule by mouth 2 (two) times daily.  pyridoxine (B-6) 100 MG tablet Take 100 mg by mouth 2 (two) times daily.      tretinoin (RETIN-A) 0.1 % cream Apply 1 Application topically at bedtime as needed (acne).      Allergies  Allergen Reactions   Reglan [Metoclopramide] Shortness Of Breath    Felt bad    Social History   Tobacco Use   Smoking status: Never   Smokeless tobacco: Not on file  Substance Use Topics   Alcohol use: No    No family history on file.   Review of Systems  Musculoskeletal:  Positive for gait problem  and joint swelling.  All other systems reviewed and are negative.   Objective:  Physical Exam Vitals reviewed.  Constitutional:      Appearance: Normal appearance. She is normal weight.  HENT:     Head: Normocephalic and atraumatic.  Eyes:     Extraocular Movements: Extraocular movements intact.     Pupils: Pupils are equal, round, and reactive to light.  Cardiovascular:     Rate and Rhythm: Normal rate.     Pulses: Normal pulses.  Pulmonary:     Effort: Pulmonary effort is normal.     Breath sounds: Normal breath sounds.  Abdominal:     Palpations: Abdomen is soft.  Musculoskeletal:     Cervical back: Normal range of motion and neck supple.     Right knee: Effusion, bony tenderness and crepitus present. Decreased range of motion. Tenderness present over the medial joint line and lateral joint line. Abnormal alignment and abnormal meniscus.  Neurological:     Mental Status: She is alert and oriented to person, place, and time.  Psychiatric:        Behavior: Behavior normal.     Vital signs in last 24 hours:    Labs:   Estimated body mass index is 20.89 kg/m as calculated from the following:   Height as of 07/21/22: 5\' 2"  (1.575 m).   Weight as of 07/21/22: 51.8 kg.   Imaging Review Plain radiographs demonstrate severe degenerative joint disease of the right knee(s). The overall alignment ismild varus. The bone quality appears to be good for age and reported activity level.      Assessment/Plan:  End stage arthritis, right knee   The patient history, physical examination, clinical judgment of the provider and imaging studies are consistent with end stage degenerative joint disease of the right knee(s) and total knee arthroplasty is deemed medically necessary. The treatment options including medical management, injection therapy arthroscopy and arthroplasty were discussed at length. The risks and benefits of total knee arthroplasty were presented and reviewed. The  risks due to aseptic loosening, infection, stiffness, patella tracking problems, thromboembolic complications and other imponderables were discussed. The patient acknowledged the explanation, agreed to proceed with the plan and consent was signed. Patient is being admitted for inpatient treatment for surgery, pain control, PT, OT, prophylactic antibiotics, VTE prophylaxis, progressive ambulation and ADL's and discharge planning. The patient is planning to be discharged home with home health services

## 2022-07-27 NOTE — Telephone Encounter (Signed)
Ortho bundle pre-op call completed. 

## 2022-07-28 ENCOUNTER — Ambulatory Visit (HOSPITAL_BASED_OUTPATIENT_CLINIC_OR_DEPARTMENT_OTHER): Payer: Medicare PPO | Admitting: Certified Registered Nurse Anesthetist

## 2022-07-28 ENCOUNTER — Encounter (HOSPITAL_COMMUNITY): Payer: Self-pay | Admitting: Orthopaedic Surgery

## 2022-07-28 ENCOUNTER — Observation Stay (HOSPITAL_COMMUNITY)
Admission: RE | Admit: 2022-07-28 | Discharge: 2022-07-31 | Disposition: A | Discharge status: Home / Self Care | Payer: Medicare PPO | Attending: Orthopaedic Surgery | Admitting: Orthopaedic Surgery

## 2022-07-28 ENCOUNTER — Ambulatory Visit (HOSPITAL_COMMUNITY): Payer: Medicare PPO

## 2022-07-28 ENCOUNTER — Observation Stay (HOSPITAL_COMMUNITY): Payer: Medicare PPO

## 2022-07-28 ENCOUNTER — Other Ambulatory Visit: Payer: Self-pay

## 2022-07-28 ENCOUNTER — Ambulatory Visit (HOSPITAL_COMMUNITY): Payer: Medicare PPO | Admitting: Certified Registered Nurse Anesthetist

## 2022-07-28 ENCOUNTER — Encounter (HOSPITAL_COMMUNITY)
Admission: RE | Disposition: A | Discharge status: Home / Self Care | Payer: Self-pay | Source: Home / Self Care | Attending: Orthopaedic Surgery

## 2022-07-28 DIAGNOSIS — I10 Essential (primary) hypertension: Secondary | ICD-10-CM | POA: Insufficient documentation

## 2022-07-28 DIAGNOSIS — Z79899 Other long term (current) drug therapy: Secondary | ICD-10-CM | POA: Diagnosis not present

## 2022-07-28 DIAGNOSIS — E039 Hypothyroidism, unspecified: Secondary | ICD-10-CM | POA: Insufficient documentation

## 2022-07-28 DIAGNOSIS — G8918 Other acute postprocedural pain: Secondary | ICD-10-CM | POA: Diagnosis not present

## 2022-07-28 DIAGNOSIS — Z96651 Presence of right artificial knee joint: Secondary | ICD-10-CM | POA: Diagnosis not present

## 2022-07-28 DIAGNOSIS — Z471 Aftercare following joint replacement surgery: Secondary | ICD-10-CM | POA: Diagnosis not present

## 2022-07-28 DIAGNOSIS — Z7982 Long term (current) use of aspirin: Secondary | ICD-10-CM | POA: Insufficient documentation

## 2022-07-28 DIAGNOSIS — M1711 Unilateral primary osteoarthritis, right knee: Secondary | ICD-10-CM

## 2022-07-28 HISTORY — PX: TOTAL KNEE ARTHROPLASTY: SHX125

## 2022-07-28 LAB — TYPE AND SCREEN
ABO/RH(D): A POS
Antibody Screen: NEGATIVE

## 2022-07-28 SURGERY — ARTHROPLASTY, KNEE, TOTAL
Anesthesia: Spinal | Site: Knee | Laterality: Right

## 2022-07-28 MED ORDER — ACETAMINOPHEN 500 MG PO TABS: 1000.0000 mg | ORAL_TABLET | Freq: Once | ORAL | Status: DC

## 2022-07-28 MED ORDER — ASPIRIN 81 MG PO CHEW
81.0000 mg | CHEWABLE_TABLET | Freq: Two times a day (BID) | ORAL | Status: DC
  Administered 2022-07-28 – 2022-07-31 (×6): 81 mg via ORAL
  Filled 2022-07-28 (×6): qty 1

## 2022-07-28 MED ORDER — MIDAZOLAM HCL 2 MG/2ML IJ SOLN
2.0000 mg | Freq: Once | INTRAMUSCULAR | Status: DC
  Filled 2022-07-28: qty 2

## 2022-07-28 MED ORDER — PROPOFOL 500 MG/50ML IV EMUL
INTRAVENOUS | Status: DC | PRN
  Administered 2022-07-28: 75 ug/kg/min via INTRAVENOUS

## 2022-07-28 MED ORDER — BUPIVACAINE HCL (PF) 0.5 % IJ SOLN
INTRAMUSCULAR | Status: AC
  Filled 2022-07-28: qty 30

## 2022-07-28 MED ORDER — VITAMIN B-6 100 MG PO TABS
100.0000 mg | ORAL_TABLET | Freq: Two times a day (BID) | ORAL | Status: DC
  Administered 2022-07-29 – 2022-07-31 (×5): 100 mg via ORAL
  Filled 2022-07-28 (×6): qty 1

## 2022-07-28 MED ORDER — EPINEPHRINE PF 1 MG/ML IJ SOLN
INTRAMUSCULAR | Status: DC | PRN
  Administered 2022-07-28: .15 mL

## 2022-07-28 MED ORDER — HYDROMORPHONE HCL 1 MG/ML IJ SOLN
0.5000 mg | INTRAMUSCULAR | Status: DC | PRN
  Administered 2022-07-28: 0.5 mg via INTRAVENOUS
  Administered 2022-07-29 (×2): 1 mg via INTRAVENOUS
  Filled 2022-07-28 (×3): qty 1

## 2022-07-28 MED ORDER — ONDANSETRON HCL 4 MG/2ML IJ SOLN
INTRAMUSCULAR | Status: DC | PRN
  Administered 2022-07-28: 4 mg via INTRAVENOUS

## 2022-07-28 MED ORDER — FENTANYL CITRATE PF 50 MCG/ML IJ SOSY
50.0000 ug | PREFILLED_SYRINGE | Freq: Once | INTRAMUSCULAR | Status: AC
  Administered 2022-07-28: 50 ug via INTRAVENOUS
  Filled 2022-07-28: qty 2

## 2022-07-28 MED ORDER — LEVOTHYROXINE SODIUM 25 MCG PO TABS
25.0000 ug | ORAL_TABLET | ORAL | Status: DC
  Administered 2022-07-30 – 2022-07-31 (×2): 25 ug via ORAL
  Filled 2022-07-28 (×2): qty 1

## 2022-07-28 MED ORDER — PHENYLEPHRINE 80 MCG/ML (10ML) SYRINGE FOR IV PUSH (FOR BLOOD PRESSURE SUPPORT)
PREFILLED_SYRINGE | INTRAVENOUS | Status: DC | PRN
  Administered 2022-07-28: 80 ug via INTRAVENOUS

## 2022-07-28 MED ORDER — PHENOL 1.4 % MT LIQD: 1.0000 | OROMUCOSAL | Status: DC | PRN

## 2022-07-28 MED ORDER — LORAZEPAM 0.5 MG PO TABS
0.5000 mg | ORAL_TABLET | Freq: Every day | ORAL | Status: DC
  Administered 2022-07-28 – 2022-07-30 (×3): 0.5 mg via ORAL
  Filled 2022-07-28 (×3): qty 1

## 2022-07-28 MED ORDER — ALUM & MAG HYDROXIDE-SIMETH 200-200-20 MG/5ML PO SUSP: 30.0000 mL | ORAL | Status: DC | PRN

## 2022-07-28 MED ORDER — FENTANYL CITRATE PF 50 MCG/ML IJ SOSY: 25.0000 ug | PREFILLED_SYRINGE | INTRAMUSCULAR | Status: DC | PRN

## 2022-07-28 MED ORDER — 0.9 % SODIUM CHLORIDE (POUR BTL) OPTIME
TOPICAL | Status: DC | PRN
  Administered 2022-07-28: 1000 mL

## 2022-07-28 MED ORDER — DOCUSATE SODIUM 100 MG PO CAPS
100.0000 mg | ORAL_CAPSULE | Freq: Two times a day (BID) | ORAL | Status: DC
  Administered 2022-07-28 – 2022-07-31 (×6): 100 mg via ORAL
  Filled 2022-07-28 (×6): qty 1

## 2022-07-28 MED ORDER — SODIUM CHLORIDE 0.9 % IV SOLN: INTRAVENOUS | Status: DC

## 2022-07-28 MED ORDER — LEVOTHYROXINE SODIUM 25 MCG PO TABS: 25.0000 ug | ORAL_TABLET | ORAL | Status: DC

## 2022-07-28 MED ORDER — BUPIVACAINE HCL 0.25 % IJ SOLN
INTRAMUSCULAR | Status: AC
  Filled 2022-07-28: qty 1

## 2022-07-28 MED ORDER — EPINEPHRINE PF 1 MG/ML IJ SOLN
INTRAMUSCULAR | Status: AC
  Filled 2022-07-28: qty 1

## 2022-07-28 MED ORDER — DEXAMETHASONE SODIUM PHOSPHATE 10 MG/ML IJ SOLN
INTRAMUSCULAR | Status: DC | PRN
  Administered 2022-07-28: 5 mg via INTRAVENOUS

## 2022-07-28 MED ORDER — TRANEXAMIC ACID-NACL 1000-0.7 MG/100ML-% IV SOLN
1000.0000 mg | INTRAVENOUS | Status: AC
  Administered 2022-07-28: 1000 mg via INTRAVENOUS
  Filled 2022-07-28: qty 100

## 2022-07-28 MED ORDER — ACETAMINOPHEN 325 MG PO TABS
325.0000 mg | ORAL_TABLET | Freq: Four times a day (QID) | ORAL | Status: DC | PRN
  Administered 2022-07-30: 650 mg via ORAL
  Filled 2022-07-28: qty 2

## 2022-07-28 MED ORDER — OXYCODONE HCL 5 MG PO TABS
10.0000 mg | ORAL_TABLET | ORAL | Status: DC | PRN
  Administered 2022-07-30 (×2): 10 mg via ORAL
  Filled 2022-07-28: qty 2

## 2022-07-28 MED ORDER — CHLORHEXIDINE GLUCONATE 0.12 % MT SOLN
15.0000 mL | Freq: Once | OROMUCOSAL | Status: AC
  Administered 2022-07-28: 15 mL via OROMUCOSAL

## 2022-07-28 MED ORDER — DIPHENHYDRAMINE HCL 12.5 MG/5ML PO ELIX: 12.5000 mg | ORAL_SOLUTION | ORAL | Status: DC | PRN

## 2022-07-28 MED ORDER — LACTATED RINGERS IV SOLN: INTRAVENOUS | Status: DC

## 2022-07-28 MED ORDER — MENTHOL 3 MG MT LOZG: 1.0000 | LOZENGE | OROMUCOSAL | Status: DC | PRN

## 2022-07-28 MED ORDER — ONDANSETRON HCL 4 MG/2ML IJ SOLN
INTRAMUSCULAR | Status: AC
  Filled 2022-07-28: qty 2

## 2022-07-28 MED ORDER — ROPIVACAINE HCL 5 MG/ML IJ SOLN
INTRAMUSCULAR | Status: DC | PRN
  Administered 2022-07-28: 25 mL via PERINEURAL

## 2022-07-28 MED ORDER — OXYCODONE HCL 5 MG PO TABS
5.0000 mg | ORAL_TABLET | ORAL | Status: DC | PRN
  Administered 2022-07-28 – 2022-07-29 (×2): 5 mg via ORAL
  Administered 2022-07-29 – 2022-07-31 (×4): 10 mg via ORAL
  Filled 2022-07-28: qty 2
  Filled 2022-07-28: qty 1
  Filled 2022-07-28 (×2): qty 2
  Filled 2022-07-28: qty 1
  Filled 2022-07-28 (×2): qty 2

## 2022-07-28 MED ORDER — ONDANSETRON HCL 4 MG/2ML IJ SOLN
4.0000 mg | Freq: Four times a day (QID) | INTRAMUSCULAR | Status: DC | PRN
  Administered 2022-07-29: 4 mg via INTRAVENOUS
  Filled 2022-07-28: qty 2

## 2022-07-28 MED ORDER — ONDANSETRON HCL 4 MG PO TABS
4.0000 mg | ORAL_TABLET | Freq: Four times a day (QID) | ORAL | Status: DC | PRN
  Administered 2022-07-28: 4 mg via ORAL

## 2022-07-28 MED ORDER — BUPIVACAINE IN DEXTROSE 0.75-8.25 % IT SOLN
INTRATHECAL | Status: DC | PRN
  Administered 2022-07-28: 1.4 mL via INTRATHECAL

## 2022-07-28 MED ORDER — BUPIVACAINE HCL (PF) 0.25 % IJ SOLN
INTRAMUSCULAR | Status: DC | PRN
  Administered 2022-07-28: 30 mL

## 2022-07-28 MED ORDER — CLONIDINE HCL (ANALGESIA) 100 MCG/ML EP SOLN
EPIDURAL | Status: DC | PRN
  Administered 2022-07-28: 70 ug

## 2022-07-28 MED ORDER — CEFAZOLIN SODIUM-DEXTROSE 2-4 GM/100ML-% IV SOLN
2.0000 g | INTRAVENOUS | Status: AC
  Administered 2022-07-28: 2 g via INTRAVENOUS
  Filled 2022-07-28: qty 100

## 2022-07-28 MED ORDER — METHOCARBAMOL 1000 MG/10ML IJ SOLN: 500.0000 mg | Freq: Four times a day (QID) | INTRAVENOUS | Status: DC | PRN

## 2022-07-28 MED ORDER — PHENYLEPHRINE HCL-NACL 20-0.9 MG/250ML-% IV SOLN
INTRAVENOUS | Status: DC | PRN
  Administered 2022-07-28: 30 ug/min via INTRAVENOUS

## 2022-07-28 MED ORDER — DEXAMETHASONE SODIUM PHOSPHATE 10 MG/ML IJ SOLN
INTRAMUSCULAR | Status: AC
  Filled 2022-07-28: qty 1

## 2022-07-28 MED ORDER — SODIUM CHLORIDE 0.9 % IR SOLN
Status: DC | PRN
  Administered 2022-07-28: 1000 mL

## 2022-07-28 MED ORDER — PANTOPRAZOLE SODIUM 40 MG PO TBEC
40.0000 mg | DELAYED_RELEASE_TABLET | Freq: Every day | ORAL | Status: DC
  Administered 2022-07-28 – 2022-07-31 (×4): 40 mg via ORAL
  Filled 2022-07-28 (×4): qty 1

## 2022-07-28 MED ORDER — VITAMIN D 25 MCG (1000 UNIT) PO TABS
1000.0000 [IU] | ORAL_TABLET | Freq: Every day | ORAL | Status: DC
  Administered 2022-07-28 – 2022-07-31 (×4): 1000 [IU] via ORAL
  Filled 2022-07-28 (×4): qty 1

## 2022-07-28 MED ORDER — DROPERIDOL 2.5 MG/ML IJ SOLN: 0.6250 mg | Freq: Once | INTRAMUSCULAR | Status: DC | PRN

## 2022-07-28 MED ORDER — MIRTAZAPINE 15 MG PO TABS
15.0000 mg | ORAL_TABLET | Freq: Every day | ORAL | Status: DC
  Administered 2022-07-28 – 2022-07-30 (×3): 15 mg via ORAL
  Filled 2022-07-28 (×3): qty 1

## 2022-07-28 MED ORDER — PROPOFOL 1000 MG/100ML IV EMUL
INTRAVENOUS | Status: AC
  Filled 2022-07-28: qty 100

## 2022-07-28 MED ORDER — CEFAZOLIN SODIUM-DEXTROSE 1-4 GM/50ML-% IV SOLN
1.0000 g | Freq: Four times a day (QID) | INTRAVENOUS | Status: AC
  Administered 2022-07-28 – 2022-07-29 (×2): 1 g via INTRAVENOUS
  Filled 2022-07-28 (×2): qty 50

## 2022-07-28 MED ORDER — LABETALOL HCL 100 MG PO TABS
50.0000 mg | ORAL_TABLET | Freq: Two times a day (BID) | ORAL | Status: DC
  Administered 2022-07-28 – 2022-07-31 (×5): 50 mg via ORAL
  Filled 2022-07-28 (×6): qty 1

## 2022-07-28 MED ORDER — DEXAMETHASONE SODIUM PHOSPHATE 4 MG/ML IJ SOLN
INTRAMUSCULAR | Status: DC | PRN
  Administered 2022-07-28: 4 mg via PERINEURAL

## 2022-07-28 MED ORDER — PROPOFOL 10 MG/ML IV BOLUS
INTRAVENOUS | Status: DC | PRN
  Administered 2022-07-28: 10 mg via INTRAVENOUS
  Administered 2022-07-28: 25 mg via INTRAVENOUS

## 2022-07-28 MED ORDER — STERILE WATER FOR IRRIGATION IR SOLN
Status: DC | PRN
  Administered 2022-07-28: 2000 mL

## 2022-07-28 MED ORDER — ORAL CARE MOUTH RINSE: 15.0000 mL | Freq: Once | OROMUCOSAL | Status: AC

## 2022-07-28 MED ORDER — POVIDONE-IODINE 10 % EX SWAB: 2.0000 | Freq: Once | CUTANEOUS | Status: DC

## 2022-07-28 MED ORDER — METHOCARBAMOL 500 MG PO TABS
500.0000 mg | ORAL_TABLET | Freq: Four times a day (QID) | ORAL | Status: DC | PRN
  Administered 2022-07-28 – 2022-07-30 (×5): 500 mg via ORAL
  Filled 2022-07-28 (×5): qty 1

## 2022-07-28 SURGICAL SUPPLY — 57 items
BAG COUNTER SPONGE SURGICOUNT (BAG) IMPLANT
BAG ZIPLOCK 12X15 (MISCELLANEOUS) ×1 IMPLANT
BENZOIN TINCTURE PRP APPL 2/3 (GAUZE/BANDAGES/DRESSINGS) IMPLANT
BLADE SAG 18X100X1.27 (BLADE) ×1 IMPLANT
BLADE SURG SZ10 CARB STEEL (BLADE) ×2 IMPLANT
BNDG ELASTIC 6X5.8 VLCR STR LF (GAUZE/BANDAGES/DRESSINGS) ×2 IMPLANT
BOWL SMART MIX CTS (DISPOSABLE) IMPLANT
CEMENT BONE R 1X40 (Cement) IMPLANT
COOLER ICEMAN CLASSIC (MISCELLANEOUS) ×1 IMPLANT
COVER SURGICAL LIGHT HANDLE (MISCELLANEOUS) ×1 IMPLANT
CUFF TOURN SGL QUICK 34 (TOURNIQUET CUFF) ×1
CUFF TRNQT CYL 34X4.125X (TOURNIQUET CUFF) ×1 IMPLANT
DRAPE INCISE IOBAN 66X45 STRL (DRAPES) ×1 IMPLANT
DRAPE U-SHAPE 47X51 STRL (DRAPES) ×1 IMPLANT
DURAPREP 26ML APPLICATOR (WOUND CARE) ×1 IMPLANT
ELECT BLADE TIP CTD 4 INCH (ELECTRODE) ×1 IMPLANT
ELECT REM PT RETURN 15FT ADLT (MISCELLANEOUS) ×1 IMPLANT
FEMUR CMT CR STD SZ 6 RT KNEE (Joint) ×1 IMPLANT
FEMUR CMTD CR STD SZ 6 RT KNEE (Joint) IMPLANT
GAUZE PAD ABD 8X10 STRL (GAUZE/BANDAGES/DRESSINGS) ×2 IMPLANT
GAUZE SPONGE 4X4 12PLY STRL (GAUZE/BANDAGES/DRESSINGS) ×1 IMPLANT
GAUZE XEROFORM 1X8 LF (GAUZE/BANDAGES/DRESSINGS) IMPLANT
GLOVE BIO SURGEON STRL SZ7.5 (GLOVE) ×1 IMPLANT
GLOVE BIOGEL PI IND STRL 8 (GLOVE) ×2 IMPLANT
GLOVE ECLIPSE 8.0 STRL XLNG CF (GLOVE) ×1 IMPLANT
GOWN STRL REUS W/ TWL XL LVL3 (GOWN DISPOSABLE) ×2 IMPLANT
GOWN STRL REUS W/TWL XL LVL3 (GOWN DISPOSABLE) ×2
HANDPIECE INTERPULSE COAX TIP (DISPOSABLE) ×1
HDLS TROCR DRIL PIN KNEE 75 (PIN) ×1
HOLDER FOLEY CATH W/STRAP (MISCELLANEOUS) IMPLANT
IMMOBILIZER KNEE 20 (SOFTGOODS) ×1
IMMOBILIZER KNEE 20 THIGH 36 (SOFTGOODS) ×1 IMPLANT
INSERT TIB AS PERS SZ 6-7 14 (Insert) IMPLANT
KIT TURNOVER KIT A (KITS) IMPLANT
NS IRRIG 1000ML POUR BTL (IV SOLUTION) ×1 IMPLANT
PACK TOTAL KNEE CUSTOM (KITS) ×1 IMPLANT
PAD COLD SHLDR WRAP-ON (PAD) ×1 IMPLANT
PADDING CAST COTTON 6X4 STRL (CAST SUPPLIES) ×2 IMPLANT
PIN DRILL HDLS TROCAR 75 4PK (PIN) IMPLANT
PROTECTOR NERVE ULNAR (MISCELLANEOUS) ×1 IMPLANT
SCREW FEMALE HEX FIX 25X2.5 (ORTHOPEDIC DISPOSABLE SUPPLIES) IMPLANT
SET HNDPC FAN SPRY TIP SCT (DISPOSABLE) ×1 IMPLANT
SET PAD KNEE POSITIONER (MISCELLANEOUS) ×1 IMPLANT
SPIKE FLUID TRANSFER (MISCELLANEOUS) IMPLANT
STAPLER VISISTAT 35W (STAPLE) IMPLANT
STEM POLY PAT PLY 35M KNEE (Knees) IMPLANT
STEM TIBIA 5 DEG SZ E R KNEE (Knees) IMPLANT
STRIP CLOSURE SKIN 1/2X4 (GAUZE/BANDAGES/DRESSINGS) IMPLANT
SUT MNCRL AB 4-0 PS2 18 (SUTURE) IMPLANT
SUT VIC AB 0 CT1 27 (SUTURE) ×1
SUT VIC AB 0 CT1 27XBRD ANTBC (SUTURE) ×1 IMPLANT
SUT VIC AB 1 CT1 36 (SUTURE) ×2 IMPLANT
SUT VIC AB 2-0 CT1 27 (SUTURE) ×2
SUT VIC AB 2-0 CT1 TAPERPNT 27 (SUTURE) ×2 IMPLANT
TIBIA STEM 5 DEG SZ E R KNEE (Knees) ×1 IMPLANT
TRAY FOLEY MTR SLVR 16FR STAT (SET/KITS/TRAYS/PACK) IMPLANT
WATER STERILE IRR 1000ML POUR (IV SOLUTION) ×2 IMPLANT

## 2022-07-28 NOTE — Op Note (Signed)
Operative  Note  Date of operation: 07/28/2022 Preoperative diagnosis: Primary osteoarthritis right knee Postoperative diagnosis: Same  Procedure: Right cemented total knee arthroplasty  Implants: Biomet/Zimmer persona cemented knee system with size 6 right CR standard femur, size E right tibial tray, 14 mm thickness right medial congruent polythene insert, 35 patella button  Surgeon: Lind Guest. Ninfa Linden, MD Assistant: Benita Stabile, PA-C  Anesthesia: #1 right lower extremity adductor canal block, #2 spinal, #3 local EBL: Less than 100 cc Tourniquet time: Less than 1 hour Antibiotics: 2 g IV Ancef Complications: None  Indications: The patient is a an 82 year old female with debilitating arthritis involving her right knee that is been longstanding.  Her x-rays show significant varus malalignment and bone-on-bone wear.  At this point her right knee pain is daily and it is detrimentally affecting her activities day living, her mobility and her quality of life to the point she wished to proceed with total knee arthroplasty.  We agree with this as well.  We talked in length in detail about the risk of acute blood loss anemia, nerve or vessel injury, fracture, infection, DVT, implant failure and wound healing issues.  We talked about her goals being hopefully decrease pain, improve mobility and overall improve quality of life.  Procedure description: After informed consent was obtained and the appropriate right knee was marked, anesthesia obtained a right lower extremity adductor canal block in the holding room.  The patient was then brought to the operating room and set up on the operating table where spinal anesthesia was obtained.  She was then laid in supine position on the operating table and a Foley catheter was placed.  A nonsterile tourniquet was placed around her upper right thigh and her right thigh, knee, leg, ankle and foot were prepped and draped with DuraPrep and sterile drapes  including a sterile stockinette.  A timeout was called and she identified as correct patient correct right knee.  An Esmarch was used to wrap the leg and the tourniquet plated to 300 mm of pressure.  We made a direct midline incision over the patella and carried this proximally distally.  Dissection was carried down to the knee joint and a medial parapatellar arthrotomy was made.  A large joint effusion was encountered.  With the knee in a flexed position we found complete cartilage loss on the medial compartment the knee and the patellofemoral joint.  Osteophytes removed from all 3 compartments as well as remnants of the ACL and medial lateral meniscus.  We then used an extramedullary cutting guide for making her proximal tibia cut correction for varus and valgus at 3 degree slope.  We made this cut to take 2 mm off the low side.  The cut was made without difficulty.  We then went to the femur and used a intramedullary cutting guide for distal femoral cut setting this for a right knee at 5 degrees externally rotated and 10 mm distal femoral cut.  We brought the knee back down to full extension and it took a 12 mm extension block to give her regular extension.  We then went back to the femur and put a femoral sizing guide based off the epicondylar axis.  Based off of this we chose a size 6 femur.  A 4-in-1 cutting block for size 6 femur was then placed and we made our anterior and posterior cuts followed our chamfer cuts.  Attention was then turned back to the tibia.  A size E right tibial tray was chosen  for coverage over the tibial plateau setting the rotation of the tibial tubercle on the femur.  We then made our drill hole and keel punch over this.  We then trialed our size E right tibial tray combined with our size 6 right CR standard femur.  We went up to a 14 mm medial congruent polythene insert for right knee and we are pleased with range of motion and stability without insert.  We then made our patella cut  and drilled 3 holes for size 35 patella button.  With all trial instrumentation from the knee we put her through several cycles of motion and we are pleased with range of motion and stability.  All trial sensation was then removed and the knee was irrigated with normal saline solution.  We then placed half percent Marcaine with epinephrine around the arthrotomy.  The cement was mixed in with the knee in a flexed position we cemented our Biomet Zimmer E tibial tray for right knee followed by our size 6 right CR standard femur.  We placed our 14 mm medial congruent polythene insert and cemented our size 35 patella button.  The knee was then held fully extended and compressed while the cement hardened.  Once the cement hardened the tourniquet was let down and hemostasis was obtained electrocautery.  The arthrotomy was closed with interrupted #1 Vicryl suture followed by 0 Vicryl to close the deep tissue and 2-0 Vicryl to close subcutaneous tissue.  The skin was closed with staples.  Well-padded sterile dressings applied.  She was taken off of the operating table taken recovery in stable addition with all final counts being correct and no complications noted.  Benita Stabile, PA-C did assist the entire case and beginning in and his assistance was crucial and medically necessary for soft tissue retraction and management, helping guide implant placement and a layered closure of the wound.

## 2022-07-28 NOTE — Evaluation (Signed)
Physical Therapy Evaluation Patient Details Name: Jody Summers MRN: [id number] DOB: 1941/01/15 Today's Date: 07/28/2022  History of Present Illness  Pt is an 83yo female presenting s/p R-TKA on 07/28/22. PMH: HTN, hyperthyroidism, PNA.  Clinical Impression  Jody Summers is a 82 y.o. female POD 0 s/p R-TKA. Patient reports independence with mobility at baseline; pt comes from [facility] where [place] with her husband. Patient is now limited by functional impairments (see PT problem list below) and requires supervision for bed mobility and min guard for transfers. Patient was able to ambulate 12 feet with RW and min guard level of assist. Patient instructed in exercise to facilitate ROM and circulation to manage edema. Provided incentive spirometer and with Vcs pt able to achieve . Patient will benefit from continued skilled PT interventions to address impairments and progress towards PLOF. Acute PT will follow to progress mobility and HEP in preparation for safe discharge home.       Recommendations for follow up therapy are one component of a multi-disciplinary discharge planning process, led by the attending physician.  Recommendations may be updated based on patient status, additional functional criteria and insurance authorization.  Follow Up Recommendations Follow physician's recommendations for discharge plan and follow up therapies      Assistance Recommended at Discharge Frequent or constant Supervision/Assistance  Patient can return home with the following  A little help with walking and/or transfers;A little help with bathing/dressing/bathroom;Assistance with cooking/housework;Assist for transportation;Help with stairs or ramp for entrance    Equipment Recommendations None recommended by PT (Pt has recommended DME)  Recommendations for Other Services       Functional Status Assessment Patient has had a recent decline in their functional status and demonstrates the  ability to make significant improvements in function in a reasonable and predictable amount of time.     Precautions / Restrictions Precautions Precautions: Fall Required Braces or Orthoses: Knee Immobilizer - Right Knee Immobilizer - Right: On when out of bed or walking;Discontinue post op day 2 Restrictions Weight Bearing Restrictions: No RLE Weight Bearing: Weight bearing as tolerated      Mobility  Bed Mobility Overal bed mobility: Needs Assistance Bed Mobility: Supine to Sit     Supine to sit: Supervision, HOB elevated     General bed mobility comments: For safety only    Transfers Overall transfer level: Needs assistance Equipment used: Rolling walker (2 wheels) Transfers: Sit to/from Stand Sit to Stand: Min guard           General transfer comment: For safety only.    Ambulation/Gait Ambulation/Gait assistance: Min guard Gait Distance (Feet): 12 Feet Assistive device: Rolling walker (2 wheels) Gait Pattern/deviations: Step-to pattern, Decreased stance time - right, Decreased step length - right, Knee flexed in stance - right Gait velocity: decreased     General Gait Details: Pt ambulated with RW and min guard, no physical assist required or overt LOB noted, pt with knee flexed in stance.  Stairs            Wheelchair Mobility    Modified Rankin (Stroke Patients Only)       Balance Overall balance assessment: Needs assistance Sitting-balance support: Feet supported, No upper extremity supported Sitting balance-Leahy Scale: Good     Standing balance support: Reliant on assistive device for balance, During functional activity, Bilateral upper extremity supported Standing balance-Leahy Scale: Poor  Pertinent Vitals/Pain Pain Assessment Pain Assessment: 0-10 Pain Score: 7  Pain Location: right knee Pain Descriptors / Indicators: Operative site guarding Pain Intervention(s): Monitored during  session, Repositioned, Patient requesting pain meds-RN notified, Ice applied    Home Living Family/patient expects to be discharged to:: Private residence Living Arrangements: Spouse/significant other Available Help at Discharge: Family;Available 24 hours/day Type of Home: House Home Access: Level entry         Home Equipment: Conservation officer, nature (2 wheels);Rollator (4 wheels)      Prior Function Prior Level of Function : Independent/Modified Independent             Mobility Comments: INd ADLs Comments: IND     Hand Dominance        Extremity/Trunk Assessment   Upper Extremity Assessment Upper Extremity Assessment: Overall WFL for tasks assessed    Lower Extremity Assessment Lower Extremity Assessment: RLE deficits/detail;LLE deficits/detail RLE Deficits / Details: MMT ank DF/PF 5/5, No extensor lag noted RLE Sensation: WNL LLE Deficits / Details: MMT ank DF/PF 5/5 LLE Sensation: WNL    Cervical / Trunk Assessment Cervical / Trunk Assessment: Kyphotic  Communication   Communication: No difficulties;Other (comment) (Speaks [place] as well, very engaging personality)  Cognition Arousal/Alertness: Awake/alert Behavior During Therapy: WFL for tasks assessed/performed Overall Cognitive Status: Within Functional Limits for tasks assessed                                          General Comments      Exercises Total Joint Exercises Ankle Circles/Pumps: AROM, Both, 10 reps   Assessment/Plan    PT Assessment Patient needs continued PT services  PT Problem List Decreased strength;Decreased range of motion;Decreased activity tolerance;Decreased balance;Decreased mobility;Pain       PT Treatment Interventions DME instruction;Gait training;Stair training;Functional mobility training;Therapeutic activities;Therapeutic exercise;Balance training;Neuromuscular re-education;Patient/family education    PT Goals (Current goals can be found in the Care Plan  section)  Acute Rehab PT Goals Patient Stated Goal: To live life with less pain PT Goal Formulation: With patient Time For Goal Achievement: 08/04/22 Potential to Achieve Goals: Good    Frequency 7X/week     Co-evaluation               AM-PAC PT "6 Clicks" Mobility  Outcome Measure Help needed turning from your back to your side while in a flat bed without using bedrails?: None Help needed moving from lying on your back to sitting on the side of a flat bed without using bedrails?: A Little Help needed moving to and from a bed to a chair (including a wheelchair)?: A Little Help needed standing up from a chair using your arms (e.g., wheelchair or bedside chair)?: A Little Help needed to walk in hospital room?: A Little Help needed climbing 3-5 steps with a railing? : A Little 6 Click Score: 19    End of Session Equipment Utilized During Treatment: Gait belt;Right knee immobilizer Activity Tolerance: Patient tolerated treatment well;No increased pain Patient left: in chair;with call bell/phone within reach;with chair alarm set Nurse Communication: Mobility status PT Visit Diagnosis: Pain;Difficulty in walking, not elsewhere classified (R26.2) Pain - Right/Left: Right Pain - part of body: Knee    Time: 4854-6270 PT Time Calculation (min) (ACUTE ONLY): 20 min   Charges:   PT Evaluation $PT Eval Low Complexity: 1 Low          Gwyndolyn Saxon  Carlynn Purl, [facility] Office: [phone] Weekend pager: [phone]  Shauntee Karp 07/28/2022, 6:36 PM

## 2022-07-28 NOTE — Anesthesia Procedure Notes (Signed)
Anesthesia Regional Block: Adductor canal block   Pre-Anesthetic Checklist: , timeout performed,  Correct Patient, Correct Site, Correct Laterality,  Correct Procedure, Correct Position, site marked,  Risks and benefits discussed,  Surgical consent,  Pre-op evaluation,  At surgeon's request and post-op pain management  Laterality: Lower and Right  Prep: chloraprep       Needles:  Injection technique: Single-shot  Needle Type: Stimiplex     Needle Length: 9cm  Needle Gauge: 21     Additional Needles:   Procedures:,,,, ultrasound used (permanent image in chart),,    Narrative:  Start time: 07/28/2022 10:32 AM End time: 07/28/2022 10:52 AM Injection made incrementally with aspirations every 5 mL.  Performed by: Personally  Anesthesiologist: Nolon Nations, MD  Additional Notes: BP cuff, EKG monitors applied. Sedation begun. Artery and nerve location verified with ultrasound. Anesthetic injected incrementally (5ml), slowly, and after negative aspirations under direct u/s guidance. Good fascial/perineural spread. Tolerated well.

## 2022-07-28 NOTE — Anesthesia Procedure Notes (Signed)
Spinal  Patient location during procedure: OR Start time: 07/28/2022 11:24 AM End time: 07/28/2022 11:29 AM Reason for block: surgical anesthesia Staffing Performed: anesthesiologist  Anesthesiologist: Nolon Nations, MD Performed by: Nolon Nations, MD Authorized by: Nolon Nations, MD   Preanesthetic Checklist Completed: patient identified, IV checked, site marked, risks and benefits discussed, surgical consent, monitors and equipment checked, pre-op evaluation and timeout performed Spinal Block Patient position: sitting Prep: DuraPrep and site prepped and draped Patient monitoring: heart rate, continuous pulse ox and blood pressure Approach: right paramedian Location: L4-5 Injection technique: single-shot Needle Needle type: Spinocan  Needle gauge: 25 G Needle length: 9 cm Additional Notes Expiration date of kit checked and confirmed. Patient tolerated procedure well, without complications.

## 2022-07-28 NOTE — Progress Notes (Addendum)
[facility] RN Report to floor given  Gave report to  [employer]. Room: 1339  Discussed surgery, meds given in OR and [facility], VS, IV fluids given, EBL, urine output, pain and other pertinent information. Also discussed if pt had any family or friends here or belongings with them.   Moving knee, legs, lifting hips. No pain. Foley with 82ml clear/yellow urine.  Xray was done. Family updated. Ice machine in place.   Husband was text twice, he called looking for an update, he was not getting texts. I called his phone he did not answer, called front desk with update, then he called back here in [facility] again. He was updated on how pt is doing, her room and when he could go up.   Pt exits my care.

## 2022-07-28 NOTE — Anesthesia Preprocedure Evaluation (Addendum)
Anesthesia Evaluation  Patient identified by MRN, date of birth, ID band Patient awake    Reviewed: Allergy & Precautions, NPO status , Patient's Chart, lab work & pertinent test results, reviewed documented beta blocker date and time   Airway Mallampati: II  TM Distance: >3 FB Neck ROM: Full    Dental  (+) Dental Advisory Given, Teeth Intact   Pulmonary shortness of breath, pneumonia   Pulmonary exam normal breath sounds clear to auscultation       Cardiovascular hypertension, Pt. on home beta blockers and Pt. on medications  Rhythm:Regular Rate:Normal     Neuro/Psych  PSYCHIATRIC DISORDERS Anxiety Depression    negative neurological ROS     GI/Hepatic negative GI ROS, Neg liver ROS,,,  Endo/Other   Hyperthyroidism   Renal/GU negative Renal ROS     Musculoskeletal  (+) Arthritis ,    Abdominal   Peds  Hematology negative hematology ROS (+)   Anesthesia Other Findings   Reproductive/Obstetrics                             Anesthesia Physical Anesthesia Plan  ASA: 2  Anesthesia Plan: Spinal   Post-op Pain Management: Tylenol PO (pre-op)* and Regional block*   Induction: Intravenous  PONV Risk Score and Plan: 3 and Ondansetron, Treatment may vary due to age or medical condition and Dexamethasone  Airway Management Planned: Natural Airway  Additional Equipment:   Intra-op Plan:   Post-operative Plan:   Informed Consent: I have reviewed the patients History and Physical, chart, labs and discussed the procedure including the risks, benefits and alternatives for the proposed anesthesia with the patient or authorized representative who has indicated his/her understanding and acceptance.     Dental advisory given  Plan Discussed with: CRNA  Anesthesia Plan Comments:         Anesthesia Quick Evaluation

## 2022-07-28 NOTE — Transfer of Care (Signed)
Immediate Anesthesia Transfer of Care Note  Patient: Jody Summers  Procedure(s) Performed: RIGHT TOTAL KNEE ARTHROPLASTY (Right: Knee)  Patient Location: PACU  Anesthesia Type:Spinal  Level of Consciousness: drowsy and patient cooperative  Airway & Oxygen Therapy: Patient Spontanous Breathing and Patient connected to face mask  Post-op Assessment: Report given to RN and Post -op Vital signs reviewed and stable  Post vital signs: Reviewed and stable  Last Vitals:  Vitals Value Taken Time  BP 106/50 07/28/22 1317  Temp    Pulse 66 07/28/22 1320  Resp 18 07/28/22 1320  SpO2 100 % 07/28/22 1320  Vitals shown include unvalidated device data.  Last Pain:  Vitals:   07/28/22 1100  TempSrc:   PainSc: 0-No pain         Complications: No notable events documented.

## 2022-07-28 NOTE — Anesthesia Postprocedure Evaluation (Signed)
Anesthesia Post Note  Patient: Jody Summers  Procedure(s) Performed: RIGHT TOTAL KNEE ARTHROPLASTY (Right: Knee)     Patient location during evaluation: PACU Anesthesia Type: Spinal Level of consciousness: awake and alert Pain management: pain level controlled Vital Signs Assessment: post-procedure vital signs reviewed and stable Respiratory status: spontaneous breathing Cardiovascular status: stable Anesthetic complications: no   No notable events documented.  Last Vitals:  Vitals:   07/28/22 1420 07/28/22 1425  BP:    Pulse: 62 63  Resp: 15 13  Temp:    SpO2: 98% 97%    Last Pain:  Vitals:   07/28/22 1415  TempSrc:   PainSc: 0-No pain                 Nolon Nations

## 2022-07-28 NOTE — Interval H&P Note (Signed)
History and Physical Interval Note: The patient understands that she is here today for a right total knee replacement to treat her severe right knee arthritis.  There has been no acute or interval change in her medical status.  See H&P.  The risks and benefits of surgery been discussed in detail and informed consent is obtained.  The right operative knee has been marked.  07/28/2022 10:11 AM  Jody Summers  has presented today for surgery, with the diagnosis of osteoarthritis right knee.  The various methods of treatment have been discussed with the patient and family. After consideration of risks, benefits and other options for treatment, the patient has consented to  Procedure(s): RIGHT TOTAL KNEE ARTHROPLASTY (Right) as a surgical intervention.  The patient's history has been reviewed, patient examined, no change in status, stable for surgery.  I have reviewed the patient's chart and labs.  Questions were answered to the patient's satisfaction.     Mcarthur Rossetti

## 2022-07-28 NOTE — Anesthesia Procedure Notes (Signed)
Procedure Name: MAC Date/Time: 07/28/2022 11:30 AM  Performed by: Claudia Desanctis, CRNAPre-anesthesia Checklist: Patient identified, Emergency Drugs available, Suction available and Patient being monitored Patient Re-evaluated:Patient Re-evaluated prior to induction Oxygen Delivery Method: Simple face mask

## 2022-07-29 DIAGNOSIS — I10 Essential (primary) hypertension: Secondary | ICD-10-CM | POA: Diagnosis not present

## 2022-07-29 DIAGNOSIS — Z7982 Long term (current) use of aspirin: Secondary | ICD-10-CM | POA: Diagnosis not present

## 2022-07-29 DIAGNOSIS — E039 Hypothyroidism, unspecified: Secondary | ICD-10-CM | POA: Diagnosis not present

## 2022-07-29 DIAGNOSIS — Z79899 Other long term (current) drug therapy: Secondary | ICD-10-CM | POA: Diagnosis not present

## 2022-07-29 DIAGNOSIS — M1711 Unilateral primary osteoarthritis, right knee: Secondary | ICD-10-CM | POA: Diagnosis not present

## 2022-07-29 LAB — CBC
HCT: 33.7 % — ABNORMAL LOW (ref 36.0–46.0)
Hemoglobin: 11.5 g/dL — ABNORMAL LOW (ref 12.0–15.0)
MCH: 30 pg (ref 26.0–34.0)
MCHC: 34.1 g/dL (ref 30.0–36.0)
MCV: 88 fL (ref 80.0–100.0)
Platelets: 173 K/uL (ref 150–400)
RBC: 3.83 MIL/uL — ABNORMAL LOW (ref 3.87–5.11)
RDW: 12.5 % (ref 11.5–15.5)
WBC: 9.5 K/uL (ref 4.0–10.5)
nRBC: 0 % (ref 0.0–0.2)

## 2022-07-29 LAB — BASIC METABOLIC PANEL
Anion gap: 6 (ref 5–15)
BUN: 19 mg/dL (ref 8–23)
CO2: 24 mmol/L (ref 22–32)
Calcium: 8.3 mg/dL — ABNORMAL LOW (ref 8.9–10.3)
Chloride: 105 mmol/L (ref 98–111)
Creatinine, Ser: 0.67 mg/dL (ref 0.44–1.00)
GFR, Estimated: >60 mL/min (ref >60)
Glucose, Bld: 112 mg/dL — ABNORMAL HIGH (ref 70–99)
Potassium: 3.8 mmol/L (ref 3.5–5.1)
Sodium: 135 mmol/L (ref 135–145)

## 2022-07-29 MED ORDER — OXYCODONE HCL 5 MG PO TABS: 5.0000 mg | ORAL_TABLET | Freq: Four times a day (QID) | ORAL | 0 refills | Status: DC | PRN

## 2022-07-29 MED ORDER — METHOCARBAMOL 500 MG PO TABS: 500.0000 mg | ORAL_TABLET | Freq: Four times a day (QID) | ORAL | 1 refills | Status: DC | PRN

## 2022-07-29 MED ORDER — ASPIRIN 81 MG PO CHEW: 81.0000 mg | CHEWABLE_TABLET | Freq: Two times a day (BID) | ORAL | 0 refills | Status: AC

## 2022-07-29 NOTE — Discharge Instructions (Signed)

## 2022-07-29 NOTE — Progress Notes (Signed)
Physical Therapy Treatment Patient Details Name: Jody Summers MRN: [id number] DOB: [birth year] Today's Date: 07/29/2022   History of Present Illness Pt is an 82yo female presenting s/p R-TKA on 07/28/22. PMH: HTN, hyperthyroidism, PNA.    PT Comments    Pt seen Pod1 for first of two sessions, motivated to participate in therapy with R-KI on in supine. Orthostatic vitals were taken, see table below, BP continues to be soft. Pt required min guard for bed mobility, transfers, and short-distance ambulation in room, distance limited by pain and R-knee weakness/buckling despite use of R-KI. Provided handout and pt completed several exercises prior to requesting to discontinue secondary to pain, RN informed of pain report and request for pain medication, ice applied to R knee, further mobility deferred. We will attempt second session to progress ambulation and HEP. Will continue to follow acutely.    07/29/22 1150  Orthostatic Lying   BP- Lying 108/46  Pulse- Lying 81  Orthostatic Sitting  BP- Sitting 116/54  Pulse- Sitting 84  Orthostatic Standing at 0 minutes  BP- Standing at 0 minutes 102/69  Pulse- Standing at 0 minutes 84  Orthostatic Standing at 3 minutes  BP- Standing at 3 minutes 110/51  Pulse- Standing at 3 minutes 79      Recommendations for follow up therapy are one component of a multi-disciplinary discharge planning process, led by the attending physician.  Recommendations may be updated based on patient status, additional functional criteria and insurance authorization.  Follow Up Recommendations  Follow physician's recommendations for discharge plan and follow up therapies     Assistance Recommended at Discharge Frequent or constant Supervision/Assistance  Patient can return home with the following A little help with walking and/or transfers;A little help with bathing/dressing/bathroom;Assistance with cooking/housework;Assist for transportation;Help with stairs or ramp for  entrance   Equipment Recommendations  None recommended by PT (Pt has recommended DME)    Recommendations for Other Services       Precautions / Restrictions Precautions Precautions: Fall Required Braces or Orthoses: Knee Immobilizer - Right Knee Immobilizer - Right: On when out of bed or walking;Discontinue post op day 2 Restrictions Weight Bearing Restrictions: No RLE Weight Bearing: Weight bearing as tolerated     Mobility  Bed Mobility Overal bed mobility: Needs Assistance Bed Mobility: Supine to Sit     Supine to sit: Supervision, HOB elevated     General bed mobility comments: For safety only    Transfers Overall transfer level: Needs assistance Equipment used: Rolling walker (2 wheels) Transfers: Sit to/from Stand Sit to Stand: Min guard           General transfer comment: For safety only.    Ambulation/Gait Ambulation/Gait assistance: Min guard Gait Distance (Feet): 12 Feet Assistive device: Rolling walker (2 wheels) Gait Pattern/deviations: Step-to pattern, Decreased stance time - right, Decreased step length - right, Knee flexed in stance - right Gait velocity: decreased     General Gait Details: Pt ambulated with RW and min guard, no physical assist required or overt LOB noted, pt with knee flexed in stance. Pt reporting pain with weightbearing, distance limited by pain.   Stairs             Wheelchair Mobility    Modified Rankin (Stroke Patients Only)       Balance Overall balance assessment: Needs assistance Sitting-balance support: Feet supported, No upper extremity supported Sitting balance-Leahy Scale: Good     Standing balance support: Reliant on assistive device for balance, During functional activity,  Bilateral upper extremity supported Standing balance-Leahy Scale: Poor                              Cognition Arousal/Alertness: Awake/alert Behavior During Therapy: WFL for tasks assessed/performed Overall  Cognitive Status: Within Functional Limits for tasks assessed                                          Exercises Total Joint Exercises Ankle Circles/Pumps: AROM, Both, 10 reps Quad Sets: AROM, Right, 10 reps, Seated Heel Slides: AROM, Right, 5 reps (discontinued secondary to pain)    General Comments        Pertinent Vitals/Pain Pain Assessment Pain Assessment: 0-10 Pain Score: 7  Pain Location: right knee Pain Descriptors / Indicators: Operative site guarding Pain Intervention(s): Limited activity within patient's tolerance, Monitored during session, Repositioned, Ice applied, Patient requesting pain meds-RN notified    Home Living Family/patient expects to be discharged to:: Private residence Living Arrangements: Spouse/significant other Available Help at Discharge: Family;Available 24 hours/day Type of Home: House Home Access: Level entry         Home Equipment: Conservation officer, nature (2 wheels);Rollator (4 wheels)      Prior Function            PT Goals (current goals can now be found in the care plan section) Acute Rehab PT Goals Patient Stated Goal: To live life with less pain PT Goal Formulation: With patient Time For Goal Achievement: 08/04/22 Potential to Achieve Goals: Good Progress towards PT goals: Not progressing toward goals - comment (Ambulation distance limited by pain, soft BPs)    Frequency    7X/week      PT Plan Current plan remains appropriate    Co-evaluation              AM-PAC PT "6 Clicks" Mobility   Outcome Measure  Help needed turning from your back to your side while in a flat bed without using bedrails?: None Help needed moving from lying on your back to sitting on the side of a flat bed without using bedrails?: A Little Help needed moving to and from a bed to a chair (including a wheelchair)?: A Little Help needed standing up from a chair using your arms (e.g., wheelchair or bedside chair)?: A Little Help  needed to walk in hospital room?: A Little Help needed climbing 3-5 steps with a railing? : A Little 6 Click Score: 19    End of Session Equipment Utilized During Treatment: Gait belt;Right knee immobilizer Activity Tolerance: Patient limited by pain Patient left: in chair;with call bell/phone within reach;with chair alarm set;with family/visitor present Nurse Communication: Mobility status;Patient requests pain meds (Pt limited by pain and with soft BPs.) PT Visit Diagnosis: Pain;Difficulty in walking, not elsewhere classified (R26.2) Pain - Right/Left: Right Pain - part of body: Knee     Time: 2376-2831 PT Time Calculation (min) (ACUTE ONLY): 39 min  Charges:  $Gait Training: 8-22 mins $Therapeutic Exercise: 23-37 mins                     Coolidge Breeze, PT, DPT [facility] Office: [phone] Weekend pager: [phone]   Akeylah Hendel 07/29/2022, 12:57 PM

## 2022-07-29 NOTE — Progress Notes (Signed)
Subjective: 1 Day Post-Op Procedure(s) (LRB): RIGHT TOTAL KNEE ARTHROPLASTY (Right) Patient reports pain as moderate.  Has not been up with therapy yet today.  Objective: Vital signs in last 24 hours: Temp:  [97.4 F (36.3 C)-98.4 F (36.9 C)] 98.4 F (36.9 C) (01/20 1007) Pulse Rate:  [62-86] 77 (01/20 1007) Resp:  [13-19] 17 (01/20 1007) BP: (98-126)/(42-64) 101/46 (01/20 1007) SpO2:  [95 %-100 %] 98 % (01/20 1007) Weight:  [51.7 kg] 51.7 kg (01/19 1628)  Intake/Output from previous day: 01/19 0701 - 01/20 0700 In: 2068.4 [P.O.:390; I.V.:1478.4; IV Piggyback:200] Out: 1300 [Urine:1250; Blood:50] Intake/Output this shift: No intake/output data recorded.  Recent Labs    07/29/22 0422  HGB 11.5*   Recent Labs    07/29/22 0422  WBC 9.5  RBC 3.83*  HCT 33.7*  PLT 173   Recent Labs    07/29/22 0422  NA 135  K 3.8  CL 105  CO2 24  BUN 19  CREATININE 0.67  GLUCOSE 112*  CALCIUM 8.3*   No results for input(s): "LABPT", "INR" in the last 72 hours.  Sensation intact distally Intact pulses distally Dorsiflexion/Plantar flexion intact Incision: dressing C/D/I No cellulitis present Compartment soft   Assessment/Plan: 1 Day Post-Op Procedure(s) (LRB): RIGHT TOTAL KNEE ARTHROPLASTY (Right) Up with therapy Plan for discharge tomorrow Discharge home with home health  I do not anticipate the patient being able to go home today since she has not been up with therapy yet today.  She is 82 years old and does have family support.  If she somehow does clear PT today and wants to go, that will be fine.  I am also fine with her staying based on the need for evaluation of her mobility.  I will go ahead and send in pain medications to her pharmacy.  If utilization review feels that she needs to be made an inpatient admission, I give them permission to do so.    Mcarthur Rossetti 07/29/2022, 11:40 AM

## 2022-07-29 NOTE — TOC Transition Note (Signed)
Transition of Care [facility]) - CM/SW Discharge Note  Patient Details  Name: Jody Summers MRN: [id number] Date of Birth: [birth year]  Transition of Care [facility] CM/SW Contact:  Sherie Don, LCSW Phone Number: 07/29/2022, 9:38 AM  Clinical Narrative: Patient is expected to discharge home after working with PT. [place] spoke with patient and husband regarding discharge plan. Patient has a rolling walker at home. Per husband, the patient will receive PT through [place] at [facility] where they reside. TOC signing off.    Final next level of care: [place] Barriers to Discharge: No Barriers Identified  Patient Goals and CMS Choice Choice offered to / list presented to : NA  Discharge Plan and Services Additional resources added to the After Visit Summary for          DME Arranged: N/A DME Agency: NA  Social Determinants of Health (SDOH) Interventions SDOH Screenings   Food Insecurity: No Food Insecurity (07/28/2022)  Housing: Low Risk  (07/28/2022)  Transportation Needs: No Transportation Needs (07/28/2022)  Utilities: Not At Risk (07/28/2022)  Tobacco Use: Unknown (07/28/2022)   Readmission Risk Interventions     No data to display

## 2022-07-29 NOTE — Progress Notes (Signed)
Physical Therapy Treatment Patient Details Name: Jody Summers MRN: [id number] DOB: [birth year] Today's Date: 07/29/2022  PT Comments    Pt seen Pod1 for second of two sessions, up in recliner, reporting 7/10 pain and some nausea, lunch in front of them but reporting inability to eat. Pt alert and oriented to self (name and birthday), day and date, but unable to name location when presented with options (hospital, post office, bank), stating, "I really am not sure." Pt required min guard for transfers and short-distance ambulation in room with RW, distance limited by pain. Required min assist for bed mobility. Attempted to encourage pt to complete exercises but pt declining citing too much pain. Pain is the primary limiting factor for mobility. Repositioned pt for comfort emphasizing no pillow under the knee, pt verbalized understanding. RN notified of pain level and pt requesting pain medications. Will continue to follow acutely.     07/29/22 1456  PT Visit Information  Assistance Needed +1  History of Present Illness Pt is an 81yo female presenting s/p R-TKA on 07/28/22. PMH: HTN, hyperthyroidism, PNA.  Subjective Data  Subjective I am a little nauseated  Patient Stated Goal To live life with less pain  Precautions  Precautions Fall  Required Braces or Orthoses Knee Immobilizer - Right  Knee Immobilizer - Right On when out of bed or walking;Discontinue post op day 2  Restrictions  Weight Bearing Restrictions No  RLE Weight Bearing WBAT  Pain Assessment  Pain Assessment 0-10  Pain Score 10  Pain Location right knee  Pain Descriptors / Indicators Operative site guarding  Pain Intervention(s) Limited activity within patient's tolerance;Monitored during session;Repositioned;Patient requesting pain meds-RN notified  Cognition  Arousal/Alertness Awake/alert  Behavior During Therapy WFL for tasks assessed/performed  Overall Cognitive Status Within Functional Limits for tasks assessed  Bed  Mobility  Overal bed mobility Needs Assistance  Bed Mobility Sit to Supine  Sit to supine Min assist  General bed mobility comments Min assist to bring BLE into bed secondary to pain  Transfers  Overall transfer level Needs assistance  Equipment used Rolling walker (2 wheels)  Transfers Sit to/from Stand  Sit to Stand Min guard  General transfer comment For safety only.  Ambulation/Gait  Ambulation/Gait assistance Min guard  Gait Distance (Feet) 8 Feet  Assistive device Rolling walker (2 wheels)  Gait Pattern/deviations Step-to pattern;Decreased stance time - right;Decreased step length - right;Knee flexed in stance - right  General Gait Details Pt ambulated with RW and min guard, no physical assist required or overt LOB noted, pt with knee flexed in stance. Pt reporting pain with weightbearing, distance limited by pain.  Gait velocity decreased  Balance  Overall balance assessment Needs assistance  Sitting-balance support Feet supported;No upper extremity supported  Sitting balance-Leahy Scale Good  Standing balance support Reliant on assistive device for balance;During functional activity;Bilateral upper extremity supported  Standing balance-Leahy Scale Poor  Exercises  Exercises Total Joint  PT - End of Session  Equipment Utilized During Treatment Gait belt;Right knee immobilizer  Activity Tolerance Patient limited by pain  Patient left with call bell/phone within reach;with family/visitor present;with bed alarm set;with SCD's reapplied  Nurse Communication Mobility status;Patient requests pain meds (Pt limited by pain)   PT - Assessment/Plan  PT Plan Current plan remains appropriate  PT Visit Diagnosis Pain;Difficulty in walking, not elsewhere classified (R26.2)  Pain - Right/Left Right  Pain - part of body Knee  PT Frequency (ACUTE ONLY) 7X/week  Follow Up Recommendations Follow physician's recommendations for  discharge plan and follow up therapies  Assistance recommended  at discharge Frequent or constant Supervision/Assistance  Patient can return home with the following A little help with walking and/or transfers;A little help with bathing/dressing/bathroom;Assistance with cooking/housework;Assist for transportation;Help with stairs or ramp for entrance  PT equipment None recommended by PT (Pt has recommended DME)  AM-PAC PT "6 Clicks" Mobility Outcome Measure (Version 2)  Help needed turning from your back to your side while in a flat bed without using bedrails? 4  Help needed moving from lying on your back to sitting on the side of a flat bed without using bedrails? 3  Help needed moving to and from a bed to a chair (including a wheelchair)? 3  Help needed standing up from a chair using your arms (e.g., wheelchair or bedside chair)? 3  Help needed to walk in hospital room? 3  Help needed climbing 3-5 steps with a railing?  3  6 Click Score 19  Consider Recommendation of Discharge To: Home with [facility]  Progressive Mobility  What is the highest level of mobility based on the progressive mobility assessment? Level 5 (Walks with assist in room/hall) - Balance while stepping forward/back and can walk in room with assist - Complete  Activity Ambulated with assistance in room  PT Goal Progression  Progress towards PT goals Not progressing toward goals - comment (pain limiting)  Acute Rehab PT Goals  PT Goal Formulation With patient  Time For Goal Achievement 08/04/22  Potential to Achieve Goals Good  PT Time Calculation  PT Start Time (ACUTE ONLY) 1425  PT Stop Time (ACUTE ONLY) 1443  PT Time Calculation (min) (ACUTE ONLY) 18 min  PT General Charges  $$ ACUTE PT VISIT 1 Visit  PT Treatments  $Gait Training 8-22 mins   Coolidge Breeze, PT, DPT [facility] Department Office: [phone] Weekend pager: [phone]

## 2022-07-29 NOTE — Progress Notes (Signed)
Patient disoriented at beginning of shift, but seems resolved now. Patient refused ice machine due to feeling that it was too cold for her knee.

## 2022-07-30 DIAGNOSIS — E039 Hypothyroidism, unspecified: Secondary | ICD-10-CM | POA: Diagnosis not present

## 2022-07-30 DIAGNOSIS — Z79899 Other long term (current) drug therapy: Secondary | ICD-10-CM | POA: Diagnosis not present

## 2022-07-30 DIAGNOSIS — M1711 Unilateral primary osteoarthritis, right knee: Secondary | ICD-10-CM | POA: Diagnosis not present

## 2022-07-30 DIAGNOSIS — Z7982 Long term (current) use of aspirin: Secondary | ICD-10-CM | POA: Diagnosis not present

## 2022-07-30 DIAGNOSIS — I10 Essential (primary) hypertension: Secondary | ICD-10-CM | POA: Diagnosis not present

## 2022-07-30 NOTE — Discharge Summary (Signed)
Patient ID: Jody Summers MRN: [id number] DOB/AGE: Feb 09, 1941 82 y.o.  Admit date: 07/28/2022 Discharge date: 07/30/2022  Admission Diagnoses:  Unilateral primary osteoarthritis, right knee  Discharge Diagnoses:  Principal Problem:   Unilateral primary osteoarthritis, right knee Active Problems:   Status post total right knee replacement   Past Medical History:  Diagnosis Date   Anxiety    Arthritis    Dyspnea    Hypertension    Hyperthyroidism    Pneumonia    aspiration pneumonia   SBO (small bowel obstruction) ([place]) 08/15/2014   Thyroid disease     Surgeries: Procedure(s): RIGHT TOTAL KNEE ARTHROPLASTY on 07/28/2022   Consultants (if any):   Discharged Condition: Improved  Hospital Course: Jody Summers is an 82 y.o. female who was admitted 07/28/2022 with a diagnosis of Unilateral primary osteoarthritis, right knee and went to the operating room on 07/28/2022 and underwent the above named procedures.    She was given perioperative antibiotics:  Anti-infectives (From admission, onward)    Start     Dose/Rate Route Frequency Ordered Stop   07/28/22 1800  ceFAZolin (ANCEF) IVPB 1 g/50 mL premix        1 g 100 mL/hr over 30 Minutes Intravenous Every 6 hours 07/28/22 1629 07/29/22 0040   07/28/22 0900  ceFAZolin (ANCEF) IVPB 2g/100 mL premix        2 g 200 mL/hr over 30 Minutes Intravenous On call to O.R. 07/28/22 3810 07/28/22 1140     .  She was given sequential compression devices, early ambulation, and appropriate chemoprophylaxis for DVT prophylaxis.  She benefited maximally from the hospital stay and there were no complications.    Recent vital signs:  Vitals:   07/29/22 2100 07/30/22 0457  BP: (!) 110/54 (!) 118/58  Pulse: 87 88  Resp: 18 20  Temp: 98.8 F (37.1 C) 98.3 F (36.8 C)  SpO2: 95% 96%    Recent laboratory studies:  Lab Results  Component Value Date   HGB 11.5 (L) 07/29/2022   HGB 13.4 07/21/2022   HGB 11.2 (L) 08/26/2014    Lab Results  Component Value Date   WBC 9.5 07/29/2022   PLT 173 07/29/2022   Lab Results  Component Value Date   INR 1.36 08/20/2014   Lab Results  Component Value Date   NA 135 07/29/2022   K 3.8 07/29/2022   CL 105 07/29/2022   CO2 24 07/29/2022   BUN 19 07/29/2022   CREATININE 0.67 07/29/2022   GLUCOSE 112 (H) 07/29/2022    Discharge Medications:   Allergies as of 07/30/2022       Reactions   Reglan [metoclopramide] Shortness Of Breath   Felt bad        Medication List     TAKE these medications    acetaminophen 500 MG tablet Commonly known as: TYLENOL Take 500 mg by mouth every 6 (six) hours as needed for moderate pain.   aspirin 81 MG chewable tablet Chew 1 tablet (81 mg total) by mouth 2 (two) times daily. What changed: when to take this   cholecalciferol 25 MCG (1000 UNIT) tablet Commonly known as: VITAMIN D3 Take 1,000 Units by mouth daily.   labetalol 100 MG tablet Commonly known as: NORMODYNE Take 50 mg by mouth 2 (two) times daily.   levothyroxine 25 MCG tablet Commonly known as: SYNTHROID Take 25 mcg by mouth See admin instructions. Take 25 mcg daily on Sun, Mon, Wed, Thurs, and Fri, skip does on  Tues and Sat   LORazepam 0.5 MG tablet Commonly known as: ATIVAN Take 0.5 mg by mouth at bedtime.   methocarbamol 500 MG tablet Commonly known as: ROBAXIN Take 1 tablet (500 mg total) by mouth every 6 (six) hours as needed for muscle spasms.   mirtazapine 15 MG tablet Commonly known as: REMERON Take 15 mg by mouth at bedtime.   oxyCODONE 5 MG immediate release tablet Commonly known as: Oxy IR/ROXICODONE Take 1 tablet (5 mg total) by mouth every 6 (six) hours as needed for moderate pain (pain score 4-6).   PreserVision AREDS 2 Caps Take 1 capsule by mouth 2 (two) times daily.   pyridoxine 100 MG tablet Commonly known as: B-6 Take 100 mg by mouth 2 (two) times daily.   tretinoin 0.1 % cream Commonly known as: RETIN-A Apply 1  Application topically at bedtime as needed (acne).               Durable Medical Equipment  (From admission, onward)           Start     Ordered   07/28/22 1630  DME 3 n 1  Once        07/28/22 1629   07/28/22 1630  DME Walker rolling  Once       Question Answer Comment  Walker: With 5 Inch Wheels   Patient needs a walker to treat with the following condition Status post total right knee replacement      07/28/22 1629            Diagnostic Studies: DG Knee Right Port  Result Date: 07/28/2022 CLINICAL DATA:  Postop right total knee arthroplasty. EXAM: PORTABLE RIGHT KNEE - 1-2 VIEW COMPARISON:  Radiographs 11/23/2021. FINDINGS: Interval right total knee arthroplasty. The hardware is well positioned. No evidence of acute fracture or dislocation. There is gas in the joint and soft tissues surrounding the knee. Anterior skin staples are present. IMPRESSION: Expected postoperative appearance status post right total knee arthroplasty. Electronically Signed   By: Richardean Sale M.D.   On: 07/28/2022 14:04    Disposition: Discharge disposition: 01-Home or [place]     Mcarthur Rossetti, MD Follow up in 2 week(s).   Specialty: Orthopedic Surgery Contact information: [address] [place] [address] [phone]                  Signed: Vanetta Mulders 07/30/2022, 8:56 AM

## 2022-07-30 NOTE — Progress Notes (Signed)
Patient's bed alarm went off, upon [place] getting to the room she was standing up without a walker, attempted to redirect patient the bed but she was extremely paranoid and suspicious of staff saying "I know what's going on here, I'm not stupid", "you drug me up and I can't get hold of my husband", "I'm calling 911, I cannot walk anymore and I should be able to move already", this writer calmly explained to the patient that she was only 2 days out from knee surgery and it takes time to regain mobility and then we offered to try and call her husband but it went straight to voicemail, patient was able to leave a message but was still very agitated with staff, unable to determine orientation as patient would not answer orientation questions, this writer explained to patient that she was only medicated with oxycodone twice and both times she had requested it for pain, patient had even requested IV pain medication at one point to which this writer said it was not needed as the oral pills were helping her pain, patient tucked back into bed, alarm armed, she is still talking about not being able to talk with her husband saying "he is always there for me, something is wrong here", explained to patient where she is, what surgery she had done, and why we needed her to stay in bed at this time or call for help if she needed the bathroom, Alisha, RN was in room for this event as well and helped tremendously to attempt to calm and reorient patient, will continue to monitor.

## 2022-07-30 NOTE — Plan of Care (Signed)
  Problem: Pain Management: Goal: Pain level will decrease with appropriate interventions Outcome: Progressing   

## 2022-07-30 NOTE — Progress Notes (Signed)
Physical Therapy Treatment Patient Details Name: Jody Summers MRN: [id number] DOB: [birth year] Today's Date: 07/30/2022   History of Present Illness Pt is an 82yo female presenting s/p R-TKA on 07/28/22. PMH: HTN, hyperthyroidism, PNA.    PT Comments    Pt making slow progress limited by pain.  Ambulated 12' with RW and min guard but mod cues for RW proximity.  Pt with very antalgic pattern with limited weight shift to R LE.  Requiring cues for safety with transfers.  Continue to recommend further therapy prior to return home.  Plan to f/u in afternoon.    Recommendations for follow up therapy are one component of a multi-disciplinary discharge planning process, led by the attending physician.  Recommendations may be updated based on patient status, additional functional criteria and insurance authorization.  Follow Up Recommendations  Follow physician's recommendations for discharge plan and follow up therapies     Assistance Recommended at Discharge Frequent or constant Supervision/Assistance  Patient can return home with the following A little help with walking and/or transfers;A little help with bathing/dressing/bathroom;Assistance with cooking/housework;Assist for transportation;Help with stairs or ramp for entrance   Equipment Recommendations  None recommended by PT    Recommendations for Other Services       Precautions / Restrictions Precautions Precautions: Fall Required Braces or Orthoses: Knee Immobilizer - Right Knee Immobilizer - Right: On when out of bed or walking;Discontinue post op day 2 Restrictions Weight Bearing Restrictions: No RLE Weight Bearing: Weight bearing as tolerated     Mobility  Bed Mobility Overal bed mobility: Needs Assistance Bed Mobility: Supine to Sit     Supine to sit: HOB elevated, Supervision     General bed mobility comments: Educated pt on use of gait belt to assist R LE; required min cues    Transfers Overall transfer level:  Needs assistance Equipment used: Rolling walker (2 wheels) Transfers: Sit to/from Stand Sit to Stand: Min guard           General transfer comment: Min guard with cues for hand placement and R LE management.  With return to sit pt started to reach for chair to pivot to sit - cued to turn completely and back until legs touch chair    Ambulation/Gait Ambulation/Gait assistance: Min guard Gait Distance (Feet): 12 Feet Assistive device: Rolling walker (2 wheels) Gait Pattern/deviations: Step-to pattern, Decreased weight shift to right, Decreased dorsiflexion - right, Antalgic Gait velocity: decreased     General Gait Details: Pt with very antalgic pattern with very little weight shift to R.  She keeps knee flexed and ambulated with foot plantar flexed.  Unable to achieve heel strike or even foot flat due to pain.  Required frequent cues to stay close to RW.  Took 3 standing rest breaks in 12' due to pain   Stairs             Wheelchair Mobility    Modified Rankin (Stroke Patients Only)       Balance Overall balance assessment: Needs assistance Sitting-balance support: Feet supported, No upper extremity supported Sitting balance-Leahy Scale: Good     Standing balance support: Reliant on assistive device for balance, During functional activity, Bilateral upper extremity supported Standing balance-Leahy Scale: Poor Standing balance comment: RW and min guard                            Cognition Arousal/Alertness: Awake/alert Behavior During Therapy: WFL for tasks assessed/performed Overall Cognitive  Status: Within Functional Limits for tasks assessed                                 General Comments: Required cues for safety with trnasfers; pt had some confusion earlier today per documentation but now alert and oriented        Exercises Total Joint Exercises Ankle Circles/Pumps: AROM, Both, 10 reps Quad Sets: AROM, 10 reps, Supine, Both  (max cues) Long Arc Quad: AAROM, Right, 10 reps, Seated (min A and max cues) Knee Flexion: AAROM, Right, 10 reps, Seated (towel under foot to slide; minimal motion due to pain) Goniometric ROM: R knee lacking 10 ext; 60 flex    General Comments        Pertinent Vitals/Pain Pain Assessment Pain Assessment: 0-10 Pain Score: 7  Pain Location: right knee Pain Descriptors / Indicators: Operative site guarding Pain Intervention(s): Limited activity within patient's tolerance, Monitored during session, Premedicated before session, Ice applied    Home Living                          Prior Function            PT Goals (current goals can now be found in the care plan section) Progress towards PT goals: Progressing toward goals    Frequency    7X/week      PT Plan Current plan remains appropriate    Co-evaluation              AM-PAC PT "6 Clicks" Mobility   Outcome Measure  Help needed turning from your back to your side while in a flat bed without using bedrails?: None Help needed moving from lying on your back to sitting on the side of a flat bed without using bedrails?: A Little Help needed moving to and from a bed to a chair (including a wheelchair)?: A Little Help needed standing up from a chair using your arms (e.g., wheelchair or bedside chair)?: A Little Help needed to walk in hospital room?: A Little Help needed climbing 3-5 steps with a railing? : Total 6 Click Score: 17    End of Session Equipment Utilized During Treatment: Gait belt Activity Tolerance: Patient limited by pain Patient left: with call bell/phone within reach;with family/visitor present;with chair alarm set;in chair Nurse Communication: Mobility status PT Visit Diagnosis: Pain;Difficulty in walking, not elsewhere classified (R26.2) Pain - Right/Left: Right Pain - part of body: Knee     Time: 8546-2703 PT Time Calculation (min) (ACUTE ONLY): 41 min  Charges:  $Gait  Training: 8-22 mins $Therapeutic Exercise: 8-22 mins $Therapeutic Activity: 8-22 mins                     Abran Richard, PT Acute Rehab [facility] [phone]    Karlton Lemon 07/30/2022, 11:10 AM

## 2022-07-30 NOTE — Progress Notes (Signed)
Subjective: 2 Days Post-Op Procedure(s) (LRB): RIGHT TOTAL KNEE ARTHROPLASTY (Right) Patient reports pain as mild.  Some confusion overnight but this has resolved.  She is somewhat frustrated with progress although she does feel much better today and continues to improve.  Objective: Vital signs in last 24 hours: Temp:  [98.3 F (36.8 C)-98.8 F (37.1 C)] 98.3 F (36.8 C) (01/21 0457) Pulse Rate:  [77-88] 88 (01/21 0457) Resp:  [17-20] 20 (01/21 0457) BP: (101-118)/(46-58) 118/58 (01/21 0457) SpO2:  [95 %-98 %] 96 % (01/21 0457)  Intake/Output from previous day: 01/20 0701 - 01/21 0700 In: 1021.6 [P.O.:600; I.V.:421.6] Out: -  Intake/Output this shift: No intake/output data recorded.  Recent Labs    07/29/22 0422  HGB 11.5*    Recent Labs    07/29/22 0422  WBC 9.5  RBC 3.83*  HCT 33.7*  PLT 173    Recent Labs    07/29/22 0422  NA 135  K 3.8  CL 105  CO2 24  BUN 19  CREATININE 0.67  GLUCOSE 112*  CALCIUM 8.3*    No results for input(s): "LABPT", "INR" in the last 72 hours.  Sensation intact distally Intact pulses distally Dorsiflexion/Plantar flexion intact Incision: dressing C/D/I No cellulitis present Compartment soft   Assessment/Plan: 2 Days Post-Op Procedure(s) (LRB): RIGHT TOTAL KNEE ARTHROPLASTY (Right)   Overall she is doing very well today.  At this time I do believe that she is likely to clear physical therapy today and plan for discharge home with her husband.  Will plan to progress with this.  Mariella Blackwelder 07/30/2022, 8:55 AM

## 2022-07-30 NOTE — Progress Notes (Signed)
Physical Therapy Treatment Patient Details Name: Jody Summers MRN: [id number] DOB: [birth year] Today's Date: 07/30/2022   History of Present Illness Pt is an 82yo female presenting s/p R-TKA on 07/28/22. PMH: HTN, hyperthyroidism, PNA.    PT Comments    Pt demonstrating significant improvements this afternoon.  She was able to ambulate 64' with RW and min guard and was min guard - min A for transfers.  Continues to require min cues for safety and exercise technique and reports increased pain.  Spouse reports after pt did not do well this morning they made plans for her to d/c tomorrow over today.  Will continue to benefit from therapy to progress while hospitalized.     Recommendations for follow up therapy are one component of a multi-disciplinary discharge planning process, led by the attending physician.  Recommendations may be updated based on patient status, additional functional criteria and insurance authorization.  Follow Up Recommendations  Follow physician's recommendations for discharge plan and follow up therapies     Assistance Recommended at Discharge Frequent or constant Supervision/Assistance  Patient can return home with the following A little help with walking and/or transfers;A little help with bathing/dressing/bathroom;Assistance with cooking/housework;Assist for transportation;Help with stairs or ramp for entrance   Equipment Recommendations  None recommended by PT    Recommendations for Other Services       Precautions / Restrictions Precautions Precautions: Fall Required Braces or Orthoses: Knee Immobilizer - Right Knee Immobilizer - Right: On when out of bed or walking;Discontinue post op day 2 Restrictions RLE Weight Bearing: Weight bearing as tolerated     Mobility  Bed Mobility Overal bed mobility: Needs Assistance Bed Mobility: Sit to Supine     Supine to sit: HOB elevated, Supervision Sit to supine: Min assist   General bed mobility comments:  Educated on use of gait belt or assist for R LE    Transfers Overall transfer level: Needs assistance Equipment used: Rolling walker (2 wheels) Transfers: Sit to/from Stand Sit to Stand: Min guard           General transfer comment: Min guard with cues for hand placement and R LE management and backing fully to chair/bed.  Performed x 3 during session    Ambulation/Gait Ambulation/Gait assistance: Min guard Gait Distance (Feet): 60 Feet Assistive device: Rolling walker (2 wheels) Gait Pattern/deviations: Step-to pattern, Decreased weight shift to right, Decreased dorsiflexion - right, Antalgic Gait velocity: decreased     General Gait Details: Improved gait speed but still antalgic pattern with limited weight shift to R and walking with knee flexed and foot plantar flexed (some progression toward foot flat with gait).  Good RW proximity without cues.   Stairs             Wheelchair Mobility    Modified Rankin (Stroke Patients Only)       Balance Overall balance assessment: Needs assistance Sitting-balance support: Feet supported, No upper extremity supported Sitting balance-Leahy Scale: Good     Standing balance support: Reliant on assistive device for balance, During functional activity, Bilateral upper extremity supported Standing balance-Leahy Scale: Poor Standing balance comment: RW and min guard                            Cognition Arousal/Alertness: Awake/alert Behavior During Therapy: WFL for tasks assessed/performed Overall Cognitive Status: Within Functional Limits for tasks assessed  General Comments: Required cues for safety with trnasfers; pt had some confusion earlier today per documentation but now alert and oriented        Exercises Total Joint Exercises Ankle Circles/Pumps: AROM, Both, 10 reps Quad Sets: AROM, 10 reps, Supine, Both (with towel roll) Heel Slides: Right, 10 reps,  AAROM (gait belt to assist) Hip ABduction/ADduction: AAROM, Right, 10 reps (gait belt to assist) Long Arc Quad: AAROM, Right, 10 reps, Seated (min A and max cues) Knee Flexion: AAROM, Right, 10 reps, Seated Goniometric ROM: R knee lacking 10 ext; 60 flex Other Exercises Other Exercises: Spouse present and educated on HEP, he was able to provide cues to pt during exercise Other Exercises: Left pt in low load long duration knee ext stretch (foot on towel roll) - encouraged to maintain at least 10-15 mins    General Comments General comments (skin integrity, edema, etc.): Educated on resting with leg straight, safe ice use, TED hose, and HEP      Pertinent Vitals/Pain Pain Assessment Pain Assessment: 0-10 Pain Score: 6  Pain Location: right knee Pain Descriptors / Indicators: Operative site guarding Pain Intervention(s): Limited activity within patient's tolerance, Monitored during session, Repositioned, Ice applied    Home Living                          Prior Function            PT Goals (current goals can now be found in the care plan section) Progress towards PT goals: Progressing toward goals    Frequency    7X/week      PT Plan Current plan remains appropriate    Co-evaluation              AM-PAC PT "6 Clicks" Mobility   Outcome Measure  Help needed turning from your back to your side while in a flat bed without using bedrails?: None Help needed moving from lying on your back to sitting on the side of a flat bed without using bedrails?: A Little Help needed moving to and from a bed to a chair (including a wheelchair)?: A Little Help needed standing up from a chair using your arms (e.g., wheelchair or bedside chair)?: A Little Help needed to walk in hospital room?: A Little Help needed climbing 3-5 steps with a railing? : A Lot 6 Click Score: 18    End of Session Equipment Utilized During Treatment: Gait belt Activity Tolerance: Patient  tolerated treatment well Patient left: with call bell/phone within reach;with family/visitor present;in bed;with bed alarm set Nurse Communication: Mobility status PT Visit Diagnosis: Pain;Difficulty in walking, not elsewhere classified (R26.2) Pain - Right/Left: Right Pain - part of body: Knee     Time: 1340-1417 PT Time Calculation (min) (ACUTE ONLY): 37 min  Charges:  $Gait Training: 8-22 mins $Therapeutic Exercise: 8-22 mins                     Abran Richard, PT Acute Rehab [facility] [phone]    Jody Summers 07/30/2022, 3:22 PM

## 2022-07-30 NOTE — Plan of Care (Signed)
  Problem: Education: ?Goal: Knowledge of the prescribed therapeutic regimen will improve ?Outcome: Progressing ?  ?Problem: Activity: ?Goal: Ability to avoid complications of mobility impairment will improve ?Outcome: Progressing ?  ?Problem: Clinical Measurements: ?Goal: Postoperative complications will be avoided or minimized ?Outcome: Progressing ?  ?Problem: Pain Management: ?Goal: Pain level will decrease with appropriate interventions ?Outcome: Progressing ?  ?

## 2022-07-31 ENCOUNTER — Encounter (HOSPITAL_COMMUNITY): Payer: Self-pay | Admitting: Orthopaedic Surgery

## 2022-07-31 DIAGNOSIS — E039 Hypothyroidism, unspecified: Secondary | ICD-10-CM | POA: Diagnosis not present

## 2022-07-31 DIAGNOSIS — Z7982 Long term (current) use of aspirin: Secondary | ICD-10-CM | POA: Diagnosis not present

## 2022-07-31 DIAGNOSIS — Z79899 Other long term (current) drug therapy: Secondary | ICD-10-CM | POA: Diagnosis not present

## 2022-07-31 DIAGNOSIS — M1711 Unilateral primary osteoarthritis, right knee: Secondary | ICD-10-CM | POA: Diagnosis not present

## 2022-07-31 DIAGNOSIS — I10 Essential (primary) hypertension: Secondary | ICD-10-CM | POA: Diagnosis not present

## 2022-07-31 NOTE — Discharge Summary (Signed)
Patient ID: Jody Summers MRN: [id number] DOB/AGE: 84-07-1940 84 y.o.  Admit date: 07/28/2022 Discharge date: 07/31/2022  Admission Diagnoses:  Principal Problem:   Unilateral primary osteoarthritis, right knee Active Problems:   Status post total right knee replacement   Discharge Diagnoses:  Same  Past Medical History:  Diagnosis Date   Anxiety    Arthritis    Dyspnea    Hypertension    Hyperthyroidism    Pneumonia    aspiration pneumonia   SBO (small bowel obstruction) ([place]) 08/15/2014   Thyroid disease     Surgeries: Procedure(s): RIGHT TOTAL KNEE ARTHROPLASTY on 07/28/2022   Consultants:   Discharged Condition: Improved  Hospital Course: Jody Summers is an 82 y.o. female who was admitted 07/28/2022 for operative treatment ofUnilateral primary osteoarthritis, right knee. Patient has severe unremitting pain that affects sleep, daily activities, and work/hobbies. After pre-op clearance the patient was taken to the operating room on 07/28/2022 and underwent  Procedure(s): RIGHT TOTAL KNEE ARTHROPLASTY.    Patient was given perioperative antibiotics:  Anti-infectives (From admission, onward)    Start     Dose/Rate Route Frequency Ordered Stop   07/28/22 1800  ceFAZolin (ANCEF) IVPB 1 g/50 mL premix        1 g 100 mL/hr over 30 Minutes Intravenous Every 6 hours 07/28/22 1629 07/29/22 0040   07/28/22 0900  ceFAZolin (ANCEF) IVPB 2g/100 mL premix        2 g 200 mL/hr over 30 Minutes Intravenous On call to O.R. 07/28/22 0851 07/28/22 1140        Patient was given sequential compression devices, early ambulation, and chemoprophylaxis to prevent DVT.  Patient benefited maximally from hospital stay and there were no complications.    Recent vital signs: Patient Vitals for the past 24 hrs:  BP Temp Temp src Pulse Resp SpO2  07/31/22 0540 (!) 117/58 98.7 F (37.1 C) -- 96 16 97 %  07/30/22 2300 (!) 105/55 -- -- -- -- --  07/30/22 2158 (!) 96/48 100 F (37.8 C)  Oral 87 16 94 %  07/30/22 1335 (!) 105/39 98.3 F (36.8 C) -- 91 18 97 %     Recent laboratory studies:  Recent Labs    07/29/22 0422  WBC 9.5  HGB 11.5*  HCT 33.7*  PLT 173  NA 135  K 3.8  CL 105  CO2 24  BUN 19  CREATININE 0.67  GLUCOSE 112*  CALCIUM 8.3*     Discharge Medications:   Allergies as of 07/31/2022       Reactions   Reglan [metoclopramide] Shortness Of Breath   Felt bad        Medication List     TAKE these medications    acetaminophen 500 MG tablet Commonly known as: TYLENOL Take 500 mg by mouth every 6 (six) hours as needed for moderate pain.   aspirin 81 MG chewable tablet Chew 1 tablet (81 mg total) by mouth 2 (two) times daily. What changed: when to take this   cholecalciferol 25 MCG (1000 UNIT) tablet Commonly known as: VITAMIN D3 Take 1,000 Units by mouth daily.   labetalol 100 MG tablet Commonly known as: NORMODYNE Take 50 mg by mouth 2 (two) times daily.   levothyroxine 25 MCG tablet Commonly known as: SYNTHROID Take 25 mcg by mouth See admin instructions. Take 25 mcg daily on Sun, Mon, Wed, Thurs, and Fri, skip does on Tues and Sat   LORazepam 0.5 MG tablet Commonly known as: ATIVAN  Take 0.5 mg by mouth at bedtime.   methocarbamol 500 MG tablet Commonly known as: ROBAXIN Take 1 tablet (500 mg total) by mouth every 6 (six) hours as needed for muscle spasms.   mirtazapine 15 MG tablet Commonly known as: REMERON Take 15 mg by mouth at bedtime.   oxyCODONE 5 MG immediate release tablet Commonly known as: Oxy IR/ROXICODONE Take 1 tablet (5 mg total) by mouth every 6 (six) hours as needed for moderate pain (pain score 4-6).   PreserVision AREDS 2 Caps Take 1 capsule by mouth 2 (two) times daily.   pyridoxine 100 MG tablet Commonly known as: B-6 Take 100 mg by mouth 2 (two) times daily.   tretinoin 0.1 % cream Commonly known as: RETIN-A Apply 1 Application topically at bedtime as needed (acne).                Durable Medical Equipment  (From admission, onward)           Start     Ordered   07/28/22 1630  DME 3 n 1  Once        07/28/22 1629   07/28/22 1630  DME Walker rolling  Once       Question Answer Comment  Walker: With 5 Inch Wheels   Patient needs a walker to treat with the following condition Status post total right knee replacement      07/28/22 1629            Diagnostic Studies: DG Knee Right Port  Result Date: 07/28/2022 CLINICAL DATA:  Postop right total knee arthroplasty. EXAM: PORTABLE RIGHT KNEE - 1-2 VIEW COMPARISON:  Radiographs 11/23/2021. FINDINGS: Interval right total knee arthroplasty. The hardware is well positioned. No evidence of acute fracture or dislocation. There is gas in the joint and soft tissues surrounding the knee. Anterior skin staples are present. IMPRESSION: Expected postoperative appearance status post right total knee arthroplasty. Electronically Signed   By: Richardean Sale M.D.   On: 07/28/2022 14:04    Disposition: Discharge disposition: 01-Home or [facility]     Mcarthur Rossetti, MD Follow up in 2 week(s).   Specialty: Orthopedic Surgery Contact information: [address] [place] [address] [phone]                  Signed: Mcarthur Rossetti 07/31/2022, 7:30 AM

## 2022-07-31 NOTE — Progress Notes (Signed)
Discharge package printed and instructions given to husband, verbalizes understanding.

## 2022-07-31 NOTE — Plan of Care (Signed)
  Problem: Pain Management: °Goal: Pain level will decrease with appropriate interventions °Outcome: Progressing °  °Problem: Safety: °Goal: Ability to remain free from injury will improve °Outcome: Progressing °  °

## 2022-07-31 NOTE — Progress Notes (Signed)
Physical Therapy Treatment Patient Details Name: Jody Summers MRN: [id number] DOB: 07-03-1941 Today's Date: 07/31/2022   History of Present Illness Pt is an 82yo female presenting s/p R-TKA on 07/28/22. PMH: HTN, hyperthyroidism, PNA.    PT Comments    Pt continues to make good improvement with mobility and pain control.  She ambulated 120' with RW and supervision.  Improved quad activation and strength progressing from AAROM to AROM exercises.  Pt does continue to have difficulty with terminal knee extension (pain and tightness) and ambulates with knee and foot flexed - worked on standing weight shifting with foot flat and terminal knee ext.   Pt demonstrates safe gait & transfers in order to return home from PT perspective once discharged by MD.  While in hospital, will continue to benefit from PT for skilled therapy to advance mobility and exercises.     Recommendations for follow up therapy are one component of a multi-disciplinary discharge planning process, led by the attending physician.  Recommendations may be updated based on patient status, additional functional criteria and insurance authorization.  Follow Up Recommendations  Follow physician's recommendations for discharge plan and follow up therapies     Assistance Recommended at Discharge Frequent or constant Supervision/Assistance  Patient can return home with the following A little help with walking and/or transfers;A little help with bathing/dressing/bathroom;Assistance with cooking/housework;Assist for transportation;Help with stairs or ramp for entrance   Equipment Recommendations  None recommended by PT    Recommendations for Other Services       Precautions / Restrictions Precautions Precautions: Fall Required Braces or Orthoses: Knee Immobilizer - Right Knee Immobilizer - Right: On when out of bed or walking;Discontinue post op day 2 Restrictions Weight Bearing Restrictions: No RLE Weight Bearing: Weight  bearing as tolerated     Mobility  Bed Mobility Overal bed mobility: Needs Assistance Bed Mobility: Supine to Sit     Supine to sit: Supervision          Transfers Overall transfer level: Needs assistance Equipment used: Rolling walker (2 wheels) Transfers: Sit to/from Stand Sit to Stand: Supervision           General transfer comment: Performed x 2; initial cues for hand placement    Ambulation/Gait Ambulation/Gait assistance: Supervision Gait Distance (Feet): 120 Feet Assistive device: Rolling walker (2 wheels) Gait Pattern/deviations: Step-to pattern, Decreased weight shift to right, Decreased dorsiflexion - right, Antalgic Gait velocity: decreased     General Gait Details: Improved gait speed and good RW proximity;  Pt still tending to ambulate with R plantarflexion and knee flexion and limited weight shift to R LE due to pain.   Stairs             Wheelchair Mobility    Modified Rankin (Stroke Patients Only)       Balance Overall balance assessment: Needs assistance Sitting-balance support: Feet supported, No upper extremity supported Sitting balance-Leahy Scale: Good     Standing balance support: Reliant on assistive device for balance, During functional activity, Bilateral upper extremity supported, No upper extremity supported Standing balance-Leahy Scale: Fair Standing balance comment: RW to ambulate but able to static stand without support                            Cognition Arousal/Alertness: Awake/alert Behavior During Therapy: WFL for tasks assessed/performed Overall Cognitive Status: Within Functional Limits for tasks assessed  Exercises Total Joint Exercises Ankle Circles/Pumps: AROM, Both, 10 reps Quad Sets: AROM, 10 reps, Supine, Both (with towel roll) Heel Slides: Right, 10 reps, AAROM, AROM (started with AAROM gait belt progressed to AROM) Hip  ABduction/ADduction: Right, 10 reps, AROM (.) Long Arc Quad: Right, 10 reps, Seated, AROM (cues for correct form) Knee Flexion: Right, 10 reps, Seated, AROM Goniometric ROM: R knee ~8 to 70 degrees Other Exercises Other Exercises: Standing with RW worked on getting R foot flat and terminal knee ext with weight shifting x 10    General Comments General comments (skin integrity, edema, etc.): Educated on resting with leg straight, safe ice use, TED hose, HEP including low load long duration knee ext stretch with towel roll under heel.  ENcouraged walking every 1-2 daytime hours and working on getting foot flat as able.      Pertinent Vitals/Pain Pain Assessment Pain Assessment: 0-10 Pain Score: 3  Pain Location: right knee Pain Descriptors / Indicators: Operative site guarding Pain Intervention(s): Limited activity within patient's tolerance, Monitored during session, Premedicated before session, Ice applied    Home Living                          Prior Function            PT Goals (current goals can now be found in the care plan section) Progress towards PT goals: Progressing toward goals    Frequency    7X/week      PT Plan Current plan remains appropriate    Co-evaluation              AM-PAC PT "6 Clicks" Mobility   Outcome Measure  Help needed turning from your back to your side while in a flat bed without using bedrails?: None Help needed moving from lying on your back to sitting on the side of a flat bed without using bedrails?: A Little Help needed moving to and from a bed to a chair (including a wheelchair)?: A Little Help needed standing up from a chair using your arms (e.g., wheelchair or bedside chair)?: A Little Help needed to walk in hospital room?: A Little Help needed climbing 3-5 steps with a railing? : A Little 6 Click Score: 19    End of Session Equipment Utilized During Treatment: Gait belt Activity Tolerance: Patient tolerated  treatment well Patient left: with call bell/phone within reach;with family/visitor present;in chair Nurse Communication: Mobility status PT Visit Diagnosis: Pain;Difficulty in walking, not elsewhere classified (R26.2) Pain - Right/Left: Right Pain - part of body: Knee     Time: 3662-9476 PT Time Calculation (min) (ACUTE ONLY): 27 min  Charges:  $Gait Training: 8-22 mins $Therapeutic Exercise: 8-22 mins                     Abran Richard, PT Acute Rehab [facility] [phone]    Karlton Lemon 07/31/2022, 11:38 AM

## 2022-07-31 NOTE — Progress Notes (Signed)
Patient ID: Jody Summers, female   DOB: 03/22/1941, 82 y.o.   MRN: [id number] The patient is stable overall.  Her right knee dressing has only scant drainage on it.  Her vital signs are stable.  She is neurovascularly intact.  She has made progress with PT.  She is stable for discharge to home by this afternoon.

## 2022-08-01 ENCOUNTER — Telehealth: Payer: Self-pay | Admitting: *Deleted

## 2022-08-01 DIAGNOSIS — M6281 Muscle weakness (generalized): Secondary | ICD-10-CM | POA: Diagnosis not present

## 2022-08-01 DIAGNOSIS — R2689 Other abnormalities of gait and mobility: Secondary | ICD-10-CM | POA: Diagnosis not present

## 2022-08-01 DIAGNOSIS — Z4789 Encounter for other orthopedic aftercare: Secondary | ICD-10-CM | POA: Diagnosis not present

## 2022-08-01 DIAGNOSIS — R278 Other lack of coordination: Secondary | ICD-10-CM | POA: Diagnosis not present

## 2022-08-01 DIAGNOSIS — R262 Difficulty in walking, not elsewhere classified: Secondary | ICD-10-CM | POA: Diagnosis not present

## 2022-08-01 DIAGNOSIS — M1711 Unilateral primary osteoarthritis, right knee: Secondary | ICD-10-CM | POA: Diagnosis not present

## 2022-08-01 NOTE — Telephone Encounter (Signed)
Ortho bundle D/C call completed. 

## 2022-08-03 DIAGNOSIS — M6281 Muscle weakness (generalized): Secondary | ICD-10-CM | POA: Diagnosis not present

## 2022-08-03 DIAGNOSIS — R278 Other lack of coordination: Secondary | ICD-10-CM | POA: Diagnosis not present

## 2022-08-03 DIAGNOSIS — M1711 Unilateral primary osteoarthritis, right knee: Secondary | ICD-10-CM | POA: Diagnosis not present

## 2022-08-03 DIAGNOSIS — Z4789 Encounter for other orthopedic aftercare: Secondary | ICD-10-CM | POA: Diagnosis not present

## 2022-08-03 DIAGNOSIS — R262 Difficulty in walking, not elsewhere classified: Secondary | ICD-10-CM | POA: Diagnosis not present

## 2022-08-03 DIAGNOSIS — R2689 Other abnormalities of gait and mobility: Secondary | ICD-10-CM | POA: Diagnosis not present

## 2022-08-04 DIAGNOSIS — Z4789 Encounter for other orthopedic aftercare: Secondary | ICD-10-CM | POA: Diagnosis not present

## 2022-08-04 DIAGNOSIS — M1711 Unilateral primary osteoarthritis, right knee: Secondary | ICD-10-CM | POA: Diagnosis not present

## 2022-08-04 DIAGNOSIS — R278 Other lack of coordination: Secondary | ICD-10-CM | POA: Diagnosis not present

## 2022-08-04 DIAGNOSIS — R262 Difficulty in walking, not elsewhere classified: Secondary | ICD-10-CM | POA: Diagnosis not present

## 2022-08-04 DIAGNOSIS — M6281 Muscle weakness (generalized): Secondary | ICD-10-CM | POA: Diagnosis not present

## 2022-08-04 DIAGNOSIS — R2689 Other abnormalities of gait and mobility: Secondary | ICD-10-CM | POA: Diagnosis not present

## 2022-08-07 ENCOUNTER — Telehealth: Payer: Self-pay | Admitting: *Deleted

## 2022-08-07 ENCOUNTER — Other Ambulatory Visit: Payer: Self-pay | Admitting: Orthopaedic Surgery

## 2022-08-07 DIAGNOSIS — M6281 Muscle weakness (generalized): Secondary | ICD-10-CM | POA: Diagnosis not present

## 2022-08-07 DIAGNOSIS — R2689 Other abnormalities of gait and mobility: Secondary | ICD-10-CM | POA: Diagnosis not present

## 2022-08-07 DIAGNOSIS — R278 Other lack of coordination: Secondary | ICD-10-CM | POA: Diagnosis not present

## 2022-08-07 DIAGNOSIS — Z4789 Encounter for other orthopedic aftercare: Secondary | ICD-10-CM | POA: Diagnosis not present

## 2022-08-07 DIAGNOSIS — R262 Difficulty in walking, not elsewhere classified: Secondary | ICD-10-CM | POA: Diagnosis not present

## 2022-08-07 DIAGNOSIS — M1711 Unilateral primary osteoarthritis, right knee: Secondary | ICD-10-CM | POA: Diagnosis not present

## 2022-08-07 MED ORDER — OXYCODONE HCL 5 MG PO TABS: 5.0000 mg | ORAL_TABLET | Freq: Four times a day (QID) | ORAL | 0 refills | Status: DC | PRN

## 2022-08-07 NOTE — Telephone Encounter (Signed)
Patient needs refill of pain medication. Pharmacy correct in chart. Thank you.

## 2022-08-07 NOTE — Telephone Encounter (Signed)
Call to patient and updated medication has been sent to pharmacy.

## 2022-08-08 DIAGNOSIS — R278 Other lack of coordination: Secondary | ICD-10-CM | POA: Diagnosis not present

## 2022-08-08 DIAGNOSIS — R262 Difficulty in walking, not elsewhere classified: Secondary | ICD-10-CM | POA: Diagnosis not present

## 2022-08-08 DIAGNOSIS — Z4789 Encounter for other orthopedic aftercare: Secondary | ICD-10-CM | POA: Diagnosis not present

## 2022-08-08 DIAGNOSIS — M6281 Muscle weakness (generalized): Secondary | ICD-10-CM | POA: Diagnosis not present

## 2022-08-08 DIAGNOSIS — R2689 Other abnormalities of gait and mobility: Secondary | ICD-10-CM | POA: Diagnosis not present

## 2022-08-08 DIAGNOSIS — M1711 Unilateral primary osteoarthritis, right knee: Secondary | ICD-10-CM | POA: Diagnosis not present

## 2022-08-09 ENCOUNTER — Encounter: Payer: Self-pay | Admitting: Orthopaedic Surgery

## 2022-08-09 ENCOUNTER — Telehealth: Payer: Self-pay | Admitting: *Deleted

## 2022-08-09 ENCOUNTER — Ambulatory Visit (INDEPENDENT_AMBULATORY_CARE_PROVIDER_SITE_OTHER): Payer: Medicare PPO | Admitting: Orthopaedic Surgery

## 2022-08-09 DIAGNOSIS — Z96651 Presence of right artificial knee joint: Secondary | ICD-10-CM

## 2022-08-09 NOTE — Progress Notes (Signed)
The patient is here for first postoperative visit status post a right total knee arthroplasty.  She has been working well with home therapy getting her knee bending and moving and is doing well.  She is 82 years old.  Her husband is with her today as well and his family.  Her extension is almost full and her flexion is well past 90 to 100 degrees and more.  The knee feels stable.  The staples are removed and Steri-Strips applied.  Her knee looks great overall.  Her calf is soft.  She was on 1 aspirin daily baby aspirin before this and go back to just 1 aspirin a day.  She will transition outpatient therapy after another week.  From my standpoint we will see her back in 4 weeks for repeat exam but no x-rays are needed.  If she does need more pain medication she knows to let us know.

## 2022-08-09 NOTE — Telephone Encounter (Signed)
Ortho bundle 14 day in office meeting completed. °

## 2022-08-11 DIAGNOSIS — M6281 Muscle weakness (generalized): Secondary | ICD-10-CM | POA: Diagnosis not present

## 2022-08-11 DIAGNOSIS — Z4789 Encounter for other orthopedic aftercare: Secondary | ICD-10-CM | POA: Diagnosis not present

## 2022-08-11 DIAGNOSIS — R262 Difficulty in walking, not elsewhere classified: Secondary | ICD-10-CM | POA: Diagnosis not present

## 2022-08-11 DIAGNOSIS — R278 Other lack of coordination: Secondary | ICD-10-CM | POA: Diagnosis not present

## 2022-08-11 DIAGNOSIS — M1711 Unilateral primary osteoarthritis, right knee: Secondary | ICD-10-CM | POA: Diagnosis not present

## 2022-08-11 DIAGNOSIS — R2689 Other abnormalities of gait and mobility: Secondary | ICD-10-CM | POA: Diagnosis not present

## 2022-08-14 DIAGNOSIS — M1711 Unilateral primary osteoarthritis, right knee: Secondary | ICD-10-CM | POA: Diagnosis not present

## 2022-08-14 DIAGNOSIS — Z4789 Encounter for other orthopedic aftercare: Secondary | ICD-10-CM | POA: Diagnosis not present

## 2022-08-14 DIAGNOSIS — R2689 Other abnormalities of gait and mobility: Secondary | ICD-10-CM | POA: Diagnosis not present

## 2022-08-14 DIAGNOSIS — R262 Difficulty in walking, not elsewhere classified: Secondary | ICD-10-CM | POA: Diagnosis not present

## 2022-08-14 DIAGNOSIS — R278 Other lack of coordination: Secondary | ICD-10-CM | POA: Diagnosis not present

## 2022-08-14 DIAGNOSIS — M6281 Muscle weakness (generalized): Secondary | ICD-10-CM | POA: Diagnosis not present

## 2022-08-16 ENCOUNTER — Other Ambulatory Visit: Payer: Self-pay | Admitting: Orthopaedic Surgery

## 2022-08-16 ENCOUNTER — Telehealth: Payer: Self-pay | Admitting: *Deleted

## 2022-08-16 DIAGNOSIS — R278 Other lack of coordination: Secondary | ICD-10-CM | POA: Diagnosis not present

## 2022-08-16 DIAGNOSIS — Z4789 Encounter for other orthopedic aftercare: Secondary | ICD-10-CM | POA: Diagnosis not present

## 2022-08-16 DIAGNOSIS — R2689 Other abnormalities of gait and mobility: Secondary | ICD-10-CM | POA: Diagnosis not present

## 2022-08-16 DIAGNOSIS — M1711 Unilateral primary osteoarthritis, right knee: Secondary | ICD-10-CM | POA: Diagnosis not present

## 2022-08-16 DIAGNOSIS — M6281 Muscle weakness (generalized): Secondary | ICD-10-CM | POA: Diagnosis not present

## 2022-08-16 DIAGNOSIS — R262 Difficulty in walking, not elsewhere classified: Secondary | ICD-10-CM | POA: Diagnosis not present

## 2022-08-16 MED ORDER — OXYCODONE HCL 5 MG PO TABS: 5.0000 mg | ORAL_TABLET | Freq: Four times a day (QID) | ORAL | 0 refills | Status: DC | PRN

## 2022-08-16 NOTE — Telephone Encounter (Signed)
Patient called requesting refill of pain medication. Thank you. 

## 2022-08-18 DIAGNOSIS — M6281 Muscle weakness (generalized): Secondary | ICD-10-CM | POA: Diagnosis not present

## 2022-08-18 DIAGNOSIS — Z4789 Encounter for other orthopedic aftercare: Secondary | ICD-10-CM | POA: Diagnosis not present

## 2022-08-18 DIAGNOSIS — R2689 Other abnormalities of gait and mobility: Secondary | ICD-10-CM | POA: Diagnosis not present

## 2022-08-18 DIAGNOSIS — R262 Difficulty in walking, not elsewhere classified: Secondary | ICD-10-CM | POA: Diagnosis not present

## 2022-08-18 DIAGNOSIS — R278 Other lack of coordination: Secondary | ICD-10-CM | POA: Diagnosis not present

## 2022-08-18 DIAGNOSIS — M1711 Unilateral primary osteoarthritis, right knee: Secondary | ICD-10-CM | POA: Diagnosis not present

## 2022-08-22 DIAGNOSIS — R2689 Other abnormalities of gait and mobility: Secondary | ICD-10-CM | POA: Diagnosis not present

## 2022-08-22 DIAGNOSIS — Z4789 Encounter for other orthopedic aftercare: Secondary | ICD-10-CM | POA: Diagnosis not present

## 2022-08-22 DIAGNOSIS — R278 Other lack of coordination: Secondary | ICD-10-CM | POA: Diagnosis not present

## 2022-08-22 DIAGNOSIS — M6281 Muscle weakness (generalized): Secondary | ICD-10-CM | POA: Diagnosis not present

## 2022-08-22 DIAGNOSIS — M1711 Unilateral primary osteoarthritis, right knee: Secondary | ICD-10-CM | POA: Diagnosis not present

## 2022-08-22 DIAGNOSIS — R262 Difficulty in walking, not elsewhere classified: Secondary | ICD-10-CM | POA: Diagnosis not present

## 2022-08-23 DIAGNOSIS — M6281 Muscle weakness (generalized): Secondary | ICD-10-CM | POA: Diagnosis not present

## 2022-08-23 DIAGNOSIS — M1711 Unilateral primary osteoarthritis, right knee: Secondary | ICD-10-CM | POA: Diagnosis not present

## 2022-08-23 DIAGNOSIS — R2689 Other abnormalities of gait and mobility: Secondary | ICD-10-CM | POA: Diagnosis not present

## 2022-08-23 DIAGNOSIS — R278 Other lack of coordination: Secondary | ICD-10-CM | POA: Diagnosis not present

## 2022-08-23 DIAGNOSIS — R262 Difficulty in walking, not elsewhere classified: Secondary | ICD-10-CM | POA: Diagnosis not present

## 2022-08-23 DIAGNOSIS — Z4789 Encounter for other orthopedic aftercare: Secondary | ICD-10-CM | POA: Diagnosis not present

## 2022-08-25 DIAGNOSIS — R278 Other lack of coordination: Secondary | ICD-10-CM | POA: Diagnosis not present

## 2022-08-25 DIAGNOSIS — R262 Difficulty in walking, not elsewhere classified: Secondary | ICD-10-CM | POA: Diagnosis not present

## 2022-08-25 DIAGNOSIS — M6281 Muscle weakness (generalized): Secondary | ICD-10-CM | POA: Diagnosis not present

## 2022-08-25 DIAGNOSIS — R2689 Other abnormalities of gait and mobility: Secondary | ICD-10-CM | POA: Diagnosis not present

## 2022-08-25 DIAGNOSIS — M1711 Unilateral primary osteoarthritis, right knee: Secondary | ICD-10-CM | POA: Diagnosis not present

## 2022-08-25 DIAGNOSIS — Z4789 Encounter for other orthopedic aftercare: Secondary | ICD-10-CM | POA: Diagnosis not present

## 2022-08-28 DIAGNOSIS — R278 Other lack of coordination: Secondary | ICD-10-CM | POA: Diagnosis not present

## 2022-08-28 DIAGNOSIS — M6281 Muscle weakness (generalized): Secondary | ICD-10-CM | POA: Diagnosis not present

## 2022-08-28 DIAGNOSIS — R2689 Other abnormalities of gait and mobility: Secondary | ICD-10-CM | POA: Diagnosis not present

## 2022-08-28 DIAGNOSIS — R262 Difficulty in walking, not elsewhere classified: Secondary | ICD-10-CM | POA: Diagnosis not present

## 2022-08-28 DIAGNOSIS — Z4789 Encounter for other orthopedic aftercare: Secondary | ICD-10-CM | POA: Diagnosis not present

## 2022-08-28 DIAGNOSIS — M1711 Unilateral primary osteoarthritis, right knee: Secondary | ICD-10-CM | POA: Diagnosis not present

## 2022-08-30 DIAGNOSIS — M1711 Unilateral primary osteoarthritis, right knee: Secondary | ICD-10-CM | POA: Diagnosis not present

## 2022-08-30 DIAGNOSIS — R262 Difficulty in walking, not elsewhere classified: Secondary | ICD-10-CM | POA: Diagnosis not present

## 2022-08-30 DIAGNOSIS — R278 Other lack of coordination: Secondary | ICD-10-CM | POA: Diagnosis not present

## 2022-08-30 DIAGNOSIS — M6281 Muscle weakness (generalized): Secondary | ICD-10-CM | POA: Diagnosis not present

## 2022-08-30 DIAGNOSIS — Z4789 Encounter for other orthopedic aftercare: Secondary | ICD-10-CM | POA: Diagnosis not present

## 2022-08-30 DIAGNOSIS — R2689 Other abnormalities of gait and mobility: Secondary | ICD-10-CM | POA: Diagnosis not present

## 2022-09-01 DIAGNOSIS — R262 Difficulty in walking, not elsewhere classified: Secondary | ICD-10-CM | POA: Diagnosis not present

## 2022-09-01 DIAGNOSIS — R278 Other lack of coordination: Secondary | ICD-10-CM | POA: Diagnosis not present

## 2022-09-01 DIAGNOSIS — M6281 Muscle weakness (generalized): Secondary | ICD-10-CM | POA: Diagnosis not present

## 2022-09-01 DIAGNOSIS — R2689 Other abnormalities of gait and mobility: Secondary | ICD-10-CM | POA: Diagnosis not present

## 2022-09-01 DIAGNOSIS — M1711 Unilateral primary osteoarthritis, right knee: Secondary | ICD-10-CM | POA: Diagnosis not present

## 2022-09-01 DIAGNOSIS — Z4789 Encounter for other orthopedic aftercare: Secondary | ICD-10-CM | POA: Diagnosis not present

## 2022-09-05 DIAGNOSIS — H6123 Impacted cerumen, bilateral: Secondary | ICD-10-CM | POA: Diagnosis not present

## 2022-09-05 DIAGNOSIS — H903 Sensorineural hearing loss, bilateral: Secondary | ICD-10-CM | POA: Diagnosis not present

## 2022-09-06 ENCOUNTER — Encounter: Payer: Self-pay | Admitting: Orthopaedic Surgery

## 2022-09-06 ENCOUNTER — Ambulatory Visit (INDEPENDENT_AMBULATORY_CARE_PROVIDER_SITE_OTHER): Payer: Medicare PPO | Admitting: Orthopaedic Surgery

## 2022-09-06 DIAGNOSIS — Z4789 Encounter for other orthopedic aftercare: Secondary | ICD-10-CM | POA: Diagnosis not present

## 2022-09-06 DIAGNOSIS — R278 Other lack of coordination: Secondary | ICD-10-CM | POA: Diagnosis not present

## 2022-09-06 DIAGNOSIS — M1711 Unilateral primary osteoarthritis, right knee: Secondary | ICD-10-CM | POA: Diagnosis not present

## 2022-09-06 DIAGNOSIS — R2689 Other abnormalities of gait and mobility: Secondary | ICD-10-CM | POA: Diagnosis not present

## 2022-09-06 DIAGNOSIS — Z96651 Presence of right artificial knee joint: Secondary | ICD-10-CM

## 2022-09-06 DIAGNOSIS — R262 Difficulty in walking, not elsewhere classified: Secondary | ICD-10-CM | POA: Diagnosis not present

## 2022-09-06 DIAGNOSIS — M6281 Muscle weakness (generalized): Secondary | ICD-10-CM | POA: Diagnosis not present

## 2022-09-06 NOTE — Progress Notes (Signed)
HPI: Ms. Bartkus returns today for follow-up of her right total knee arthroplasty.  She is 6 weeks postop.  She is overall doing well.  Having minimal discomfort.  She states she is progressing well with therapy.  She is using a cane to ambulate.  She is no longer taking any pain medicines or muscle relaxants.  She is on 81 mg aspirin which she was on prior to surgery.  Review of systems: Negative for fevers chills.  Right knee: Full extension flexion to at least 100 1015 degrees.  No instability valgus varus stressing.  Calf supple nontender dorsiflexion plantarflexion right ankle intact.  Surgical incisions healing well no signs of infection or dehiscence.  Impression: Status post right knee arthroplasty.  Plan: She will continue work with therapy for range of motion and strengthening.  Will see her back in just 4 weeks to make sure she is progressing well.  Questions were encouraged and answered.  Continue work on scar tissue mobilization.

## 2022-09-08 DIAGNOSIS — M1711 Unilateral primary osteoarthritis, right knee: Secondary | ICD-10-CM | POA: Diagnosis not present

## 2022-09-08 DIAGNOSIS — R262 Difficulty in walking, not elsewhere classified: Secondary | ICD-10-CM | POA: Diagnosis not present

## 2022-09-08 DIAGNOSIS — R2689 Other abnormalities of gait and mobility: Secondary | ICD-10-CM | POA: Diagnosis not present

## 2022-09-08 DIAGNOSIS — M6281 Muscle weakness (generalized): Secondary | ICD-10-CM | POA: Diagnosis not present

## 2022-09-08 DIAGNOSIS — Z4789 Encounter for other orthopedic aftercare: Secondary | ICD-10-CM | POA: Diagnosis not present

## 2022-09-08 DIAGNOSIS — R278 Other lack of coordination: Secondary | ICD-10-CM | POA: Diagnosis not present

## 2022-09-14 ENCOUNTER — Encounter: Payer: Self-pay | Admitting: Radiology

## 2022-10-09 ENCOUNTER — Ambulatory Visit (INDEPENDENT_AMBULATORY_CARE_PROVIDER_SITE_OTHER): Payer: Medicare PPO | Admitting: Orthopaedic Surgery

## 2022-10-09 ENCOUNTER — Encounter: Payer: Self-pay | Admitting: Orthopaedic Surgery

## 2022-10-09 DIAGNOSIS — Z96651 Presence of right artificial knee joint: Secondary | ICD-10-CM

## 2022-10-09 NOTE — Progress Notes (Signed)
The patient is an 82 year old female who is 2 and half months status post a right total knee arthroplasty.  She says she is doing great.  She is only working on home exercise program on a regular basis.  She reports good range of motion and strength and she is not even using any assistive device to ambulate with today.  Her right knee has full expection and full extension.  There is some swelling and warmth to be expected but it looks great.  It is ligamentously stable.  She will continue her home exercise program for the next 2 to 4 weeks.  From my standpoint I do not need to see her until 6 months from now.  Will have a standing AP and lateral of her right knee at that visit.  If there are issues before then they will let us know.

## 2022-11-02 DIAGNOSIS — M545 Low back pain, unspecified: Secondary | ICD-10-CM | POA: Diagnosis not present

## 2022-11-07 DIAGNOSIS — R636 Underweight: Secondary | ICD-10-CM | POA: Diagnosis not present

## 2022-11-07 DIAGNOSIS — F1320 Sedative, hypnotic or anxiolytic dependence, uncomplicated: Secondary | ICD-10-CM | POA: Diagnosis not present

## 2022-11-07 DIAGNOSIS — I129 Hypertensive chronic kidney disease with stage 1 through stage 4 chronic kidney disease, or unspecified chronic kidney disease: Secondary | ICD-10-CM | POA: Diagnosis not present

## 2022-11-07 DIAGNOSIS — E781 Pure hyperglyceridemia: Secondary | ICD-10-CM | POA: Diagnosis not present

## 2022-11-07 DIAGNOSIS — E039 Hypothyroidism, unspecified: Secondary | ICD-10-CM | POA: Diagnosis not present

## 2022-11-07 DIAGNOSIS — R809 Proteinuria, unspecified: Secondary | ICD-10-CM | POA: Diagnosis not present

## 2022-11-07 DIAGNOSIS — N182 Chronic kidney disease, stage 2 (mild): Secondary | ICD-10-CM | POA: Diagnosis not present

## 2022-11-07 DIAGNOSIS — M8580 Other specified disorders of bone density and structure, unspecified site: Secondary | ICD-10-CM | POA: Diagnosis not present

## 2022-11-07 DIAGNOSIS — E559 Vitamin D deficiency, unspecified: Secondary | ICD-10-CM | POA: Diagnosis not present

## 2023-02-08 DIAGNOSIS — R7989 Other specified abnormal findings of blood chemistry: Secondary | ICD-10-CM | POA: Diagnosis not present

## 2023-02-08 DIAGNOSIS — E781 Pure hyperglyceridemia: Secondary | ICD-10-CM | POA: Diagnosis not present

## 2023-02-08 DIAGNOSIS — E559 Vitamin D deficiency, unspecified: Secondary | ICD-10-CM | POA: Diagnosis not present

## 2023-02-08 DIAGNOSIS — E039 Hypothyroidism, unspecified: Secondary | ICD-10-CM | POA: Diagnosis not present

## 2023-02-15 DIAGNOSIS — R4189 Other symptoms and signs involving cognitive functions and awareness: Secondary | ICD-10-CM | POA: Diagnosis not present

## 2023-02-15 DIAGNOSIS — E781 Pure hyperglyceridemia: Secondary | ICD-10-CM | POA: Diagnosis not present

## 2023-02-15 DIAGNOSIS — N182 Chronic kidney disease, stage 2 (mild): Secondary | ICD-10-CM | POA: Diagnosis not present

## 2023-02-15 DIAGNOSIS — F419 Anxiety disorder, unspecified: Secondary | ICD-10-CM | POA: Diagnosis not present

## 2023-02-15 DIAGNOSIS — R82998 Other abnormal findings in urine: Secondary | ICD-10-CM | POA: Diagnosis not present

## 2023-02-15 DIAGNOSIS — Z0000 Encounter for general adult medical examination without abnormal findings: Secondary | ICD-10-CM | POA: Diagnosis not present

## 2023-02-15 DIAGNOSIS — M8580 Other specified disorders of bone density and structure, unspecified site: Secondary | ICD-10-CM | POA: Diagnosis not present

## 2023-02-15 DIAGNOSIS — E039 Hypothyroidism, unspecified: Secondary | ICD-10-CM | POA: Diagnosis not present

## 2023-02-15 DIAGNOSIS — I129 Hypertensive chronic kidney disease with stage 1 through stage 4 chronic kidney disease, or unspecified chronic kidney disease: Secondary | ICD-10-CM | POA: Diagnosis not present

## 2023-02-15 DIAGNOSIS — F1320 Sedative, hypnotic or anxiolytic dependence, uncomplicated: Secondary | ICD-10-CM | POA: Diagnosis not present

## 2023-04-11 ENCOUNTER — Other Ambulatory Visit (INDEPENDENT_AMBULATORY_CARE_PROVIDER_SITE_OTHER): Payer: Self-pay

## 2023-04-11 ENCOUNTER — Ambulatory Visit: Payer: Medicare PPO | Admitting: Orthopaedic Surgery

## 2023-04-11 ENCOUNTER — Encounter: Payer: Self-pay | Admitting: Orthopaedic Surgery

## 2023-04-11 DIAGNOSIS — Z96651 Presence of right artificial knee joint: Secondary | ICD-10-CM | POA: Diagnosis not present

## 2023-04-11 NOTE — Progress Notes (Signed)
HPI: Jody Summers 82 year old female comes in today status post right total knee arthroplasty 07/28/2022.  States she is overall doing well.  States that having the knee replacementthat she never had done.  She is very happy with the results.  She continues to work on range of motion and strengthening.  She reports that she is riding a stationary bike for an hour every day.   Review of systems: See HPI otherwise negative or noncontributory.  Physical exam: General: Well-developed well-nourished female no acute distress ambulates without any assistive device.  Nonantalgic gait. Psych: Alert and oriented x 3 Right knee: Surgical incisions well-healed no abnormal warmth erythema or effusion.  Full extension full flexion to approximately 120 degrees.  No instability valgus varus stressing.  Anterior drawer is negative.  Radiographs: Right knee 2 views: Status post right total knee arthroplasty with well-seated components.  No acute fractures or acute findings.  Knee is well located.  Impression: Status post right total knee arthroplasty 07/28/2022  Plan: She will continue work on range of motion strengthening.  She will follow-up with [place] as needed.  Questions were encouraged and answered at length by Dr. Magnus Ivan and myself.

## 2023-04-21 DIAGNOSIS — Z23 Encounter for immunization: Secondary | ICD-10-CM | POA: Diagnosis not present

## 2023-05-29 DIAGNOSIS — H2511 Age-related nuclear cataract, right eye: Secondary | ICD-10-CM | POA: Diagnosis not present

## 2023-05-29 DIAGNOSIS — H25812 Combined forms of age-related cataract, left eye: Secondary | ICD-10-CM | POA: Diagnosis not present

## 2023-05-29 DIAGNOSIS — H52223 Regular astigmatism, bilateral: Secondary | ICD-10-CM | POA: Diagnosis not present

## 2023-05-29 DIAGNOSIS — H353131 Nonexudative age-related macular degeneration, bilateral, early dry stage: Secondary | ICD-10-CM | POA: Diagnosis not present

## 2023-05-29 DIAGNOSIS — H524 Presbyopia: Secondary | ICD-10-CM | POA: Diagnosis not present

## 2023-05-29 DIAGNOSIS — H5203 Hypermetropia, bilateral: Secondary | ICD-10-CM | POA: Diagnosis not present

## 2023-06-11 DIAGNOSIS — N390 Urinary tract infection, site not specified: Secondary | ICD-10-CM | POA: Diagnosis not present

## 2023-06-25 DIAGNOSIS — Z1231 Encounter for screening mammogram for malignant neoplasm of breast: Secondary | ICD-10-CM | POA: Diagnosis not present

## 2023-07-13 DIAGNOSIS — N390 Urinary tract infection, site not specified: Secondary | ICD-10-CM | POA: Diagnosis not present

## 2023-08-16 DIAGNOSIS — I493 Ventricular premature depolarization: Secondary | ICD-10-CM | POA: Diagnosis not present

## 2023-08-16 DIAGNOSIS — R682 Dry mouth, unspecified: Secondary | ICD-10-CM | POA: Diagnosis not present

## 2023-08-16 DIAGNOSIS — I129 Hypertensive chronic kidney disease with stage 1 through stage 4 chronic kidney disease, or unspecified chronic kidney disease: Secondary | ICD-10-CM | POA: Diagnosis not present

## 2023-08-16 DIAGNOSIS — M8580 Other specified disorders of bone density and structure, unspecified site: Secondary | ICD-10-CM | POA: Diagnosis not present

## 2023-08-16 DIAGNOSIS — F1320 Sedative, hypnotic or anxiolytic dependence, uncomplicated: Secondary | ICD-10-CM | POA: Diagnosis not present

## 2023-08-16 DIAGNOSIS — M1990 Unspecified osteoarthritis, unspecified site: Secondary | ICD-10-CM | POA: Diagnosis not present

## 2023-08-16 DIAGNOSIS — E781 Pure hyperglyceridemia: Secondary | ICD-10-CM | POA: Diagnosis not present

## 2023-08-16 DIAGNOSIS — N182 Chronic kidney disease, stage 2 (mild): Secondary | ICD-10-CM | POA: Diagnosis not present

## 2023-08-16 DIAGNOSIS — E039 Hypothyroidism, unspecified: Secondary | ICD-10-CM | POA: Diagnosis not present

## 2023-08-16 DIAGNOSIS — F419 Anxiety disorder, unspecified: Secondary | ICD-10-CM | POA: Diagnosis not present

## 2023-10-08 DIAGNOSIS — L309 Dermatitis, unspecified: Secondary | ICD-10-CM | POA: Diagnosis not present

## 2023-10-11 DIAGNOSIS — H903 Sensorineural hearing loss, bilateral: Secondary | ICD-10-CM | POA: Diagnosis not present

## 2023-10-11 DIAGNOSIS — H9313 Tinnitus, bilateral: Secondary | ICD-10-CM | POA: Diagnosis not present

## 2023-11-19 DIAGNOSIS — L720 Epidermal cyst: Secondary | ICD-10-CM | POA: Diagnosis not present

## 2023-11-19 DIAGNOSIS — L309 Dermatitis, unspecified: Secondary | ICD-10-CM | POA: Diagnosis not present

## 2023-11-19 DIAGNOSIS — L821 Other seborrheic keratosis: Secondary | ICD-10-CM | POA: Diagnosis not present

## 2023-12-31 DIAGNOSIS — R300 Dysuria: Secondary | ICD-10-CM | POA: Diagnosis not present

## 2023-12-31 DIAGNOSIS — N390 Urinary tract infection, site not specified: Secondary | ICD-10-CM | POA: Diagnosis not present

## 2024-02-14 DIAGNOSIS — E039 Hypothyroidism, unspecified: Secondary | ICD-10-CM | POA: Diagnosis not present

## 2024-02-14 DIAGNOSIS — I129 Hypertensive chronic kidney disease with stage 1 through stage 4 chronic kidney disease, or unspecified chronic kidney disease: Secondary | ICD-10-CM | POA: Diagnosis not present

## 2024-02-14 DIAGNOSIS — E781 Pure hyperglyceridemia: Secondary | ICD-10-CM | POA: Diagnosis not present

## 2024-02-14 DIAGNOSIS — N182 Chronic kidney disease, stage 2 (mild): Secondary | ICD-10-CM | POA: Diagnosis not present

## 2024-02-14 DIAGNOSIS — M8589 Other specified disorders of bone density and structure, multiple sites: Secondary | ICD-10-CM | POA: Diagnosis not present

## 2024-02-14 DIAGNOSIS — E559 Vitamin D deficiency, unspecified: Secondary | ICD-10-CM | POA: Diagnosis not present

## 2024-02-22 DIAGNOSIS — R4189 Other symptoms and signs involving cognitive functions and awareness: Secondary | ICD-10-CM | POA: Diagnosis not present

## 2024-02-22 DIAGNOSIS — N182 Chronic kidney disease, stage 2 (mild): Secondary | ICD-10-CM | POA: Diagnosis not present

## 2024-02-22 DIAGNOSIS — M8580 Other specified disorders of bone density and structure, unspecified site: Secondary | ICD-10-CM | POA: Diagnosis not present

## 2024-02-22 DIAGNOSIS — R82998 Other abnormal findings in urine: Secondary | ICD-10-CM | POA: Diagnosis not present

## 2024-02-22 DIAGNOSIS — E781 Pure hyperglyceridemia: Secondary | ICD-10-CM | POA: Diagnosis not present

## 2024-02-22 DIAGNOSIS — F1320 Sedative, hypnotic or anxiolytic dependence, uncomplicated: Secondary | ICD-10-CM | POA: Diagnosis not present

## 2024-02-22 DIAGNOSIS — Z0000 Encounter for general adult medical examination without abnormal findings: Secondary | ICD-10-CM | POA: Diagnosis not present

## 2024-02-22 DIAGNOSIS — I129 Hypertensive chronic kidney disease with stage 1 through stage 4 chronic kidney disease, or unspecified chronic kidney disease: Secondary | ICD-10-CM | POA: Diagnosis not present

## 2024-02-22 DIAGNOSIS — E039 Hypothyroidism, unspecified: Secondary | ICD-10-CM | POA: Diagnosis not present

## 2024-02-22 DIAGNOSIS — F419 Anxiety disorder, unspecified: Secondary | ICD-10-CM | POA: Diagnosis not present

## 2024-03-24 DIAGNOSIS — H00015 Hordeolum externum left lower eyelid: Secondary | ICD-10-CM | POA: Diagnosis not present

## 2024-03-24 DIAGNOSIS — H2511 Age-related nuclear cataract, right eye: Secondary | ICD-10-CM | POA: Diagnosis not present

## 2024-04-03 DIAGNOSIS — N390 Urinary tract infection, site not specified: Secondary | ICD-10-CM | POA: Diagnosis not present

## 2024-04-03 DIAGNOSIS — R300 Dysuria: Secondary | ICD-10-CM | POA: Diagnosis not present

## 2024-04-14 DIAGNOSIS — N390 Urinary tract infection, site not specified: Secondary | ICD-10-CM | POA: Diagnosis not present

## 2024-06-03 DIAGNOSIS — H524 Presbyopia: Secondary | ICD-10-CM | POA: Diagnosis not present

## 2024-06-03 DIAGNOSIS — H2511 Age-related nuclear cataract, right eye: Secondary | ICD-10-CM | POA: Diagnosis not present

## 2024-06-03 DIAGNOSIS — H25812 Combined forms of age-related cataract, left eye: Secondary | ICD-10-CM | POA: Diagnosis not present

## 2024-06-03 DIAGNOSIS — H353131 Nonexudative age-related macular degeneration, bilateral, early dry stage: Secondary | ICD-10-CM | POA: Diagnosis not present

## 2024-06-03 DIAGNOSIS — H00015 Hordeolum externum left lower eyelid: Secondary | ICD-10-CM | POA: Diagnosis not present
